# Patient Record
Sex: Male | Born: 1950 | Race: White | Hispanic: No | Marital: Married | Smoking: Never smoker
Health system: Southern US, Community
[De-identification: ages and names within clinical notes are randomized; demographics above are authoritative.]

## PROBLEM LIST (undated history)

## (undated) DIAGNOSIS — C679 Malignant neoplasm of bladder, unspecified: Secondary | ICD-10-CM

## (undated) DIAGNOSIS — I219 Acute myocardial infarction, unspecified: Secondary | ICD-10-CM

## (undated) DIAGNOSIS — N2889 Other specified disorders of kidney and ureter: Secondary | ICD-10-CM

## (undated) DIAGNOSIS — N400 Enlarged prostate without lower urinary tract symptoms: Secondary | ICD-10-CM

## (undated) HISTORY — PX: TONSILLECTOMY: SUR1361

## (undated) HISTORY — PX: VASECTOMY: SHX75

## (undated) HISTORY — PX: TRANSURETHRAL RESECTION OF BLADDER TUMOR: SHX2575

## (undated) HISTORY — PX: CORONARY ANGIOPLASTY: SHX604

---

## 2007-11-29 DIAGNOSIS — I219 Acute myocardial infarction, unspecified: Secondary | ICD-10-CM

## 2007-11-29 HISTORY — DX: Acute myocardial infarction, unspecified: I21.9

## 2011-11-29 HISTORY — PX: HERNIA REPAIR: SHX51

## 2014-09-24 DIAGNOSIS — C641 Malignant neoplasm of right kidney, except renal pelvis: Secondary | ICD-10-CM | POA: Insufficient documentation

## 2014-09-24 DIAGNOSIS — N138 Other obstructive and reflux uropathy: Secondary | ICD-10-CM

## 2014-09-24 DIAGNOSIS — N401 Enlarged prostate with lower urinary tract symptoms: Secondary | ICD-10-CM

## 2014-09-24 HISTORY — DX: Benign prostatic hyperplasia with lower urinary tract symptoms: N40.1

## 2014-09-24 HISTORY — DX: Benign prostatic hyperplasia with lower urinary tract symptoms: N13.8

## 2014-09-24 HISTORY — DX: Malignant neoplasm of right kidney, except renal pelvis: C64.1

## 2014-10-13 DIAGNOSIS — I10 Essential (primary) hypertension: Secondary | ICD-10-CM

## 2014-10-13 DIAGNOSIS — E782 Mixed hyperlipidemia: Secondary | ICD-10-CM

## 2014-10-13 DIAGNOSIS — E785 Hyperlipidemia, unspecified: Secondary | ICD-10-CM

## 2014-10-13 DIAGNOSIS — N451 Epididymitis: Secondary | ICD-10-CM

## 2014-10-13 DIAGNOSIS — N509 Disorder of male genital organs, unspecified: Secondary | ICD-10-CM | POA: Insufficient documentation

## 2014-10-13 DIAGNOSIS — N486 Induration penis plastica: Secondary | ICD-10-CM

## 2014-10-13 DIAGNOSIS — K4090 Unilateral inguinal hernia, without obstruction or gangrene, not specified as recurrent: Secondary | ICD-10-CM

## 2014-10-13 DIAGNOSIS — N529 Male erectile dysfunction, unspecified: Secondary | ICD-10-CM

## 2014-10-13 DIAGNOSIS — R350 Frequency of micturition: Secondary | ICD-10-CM

## 2014-10-13 DIAGNOSIS — I2510 Atherosclerotic heart disease of native coronary artery without angina pectoris: Secondary | ICD-10-CM

## 2014-10-13 DIAGNOSIS — N4340 Spermatocele of epididymis, unspecified: Secondary | ICD-10-CM

## 2014-10-13 HISTORY — DX: Mixed hyperlipidemia: E78.2

## 2014-10-13 HISTORY — DX: Epididymitis: N45.1

## 2014-10-13 HISTORY — DX: Male erectile dysfunction, unspecified: N52.9

## 2014-10-13 HISTORY — DX: Atherosclerotic heart disease of native coronary artery without angina pectoris: I25.10

## 2014-10-13 HISTORY — DX: Frequency of micturition: R35.0

## 2014-10-13 HISTORY — DX: Spermatocele of epididymis, unspecified: N43.40

## 2014-10-13 HISTORY — DX: Hyperlipidemia, unspecified: E78.5

## 2014-10-13 HISTORY — DX: Disorder of male genital organs, unspecified: N50.9

## 2014-10-13 HISTORY — DX: Induration penis plastica: N48.6

## 2014-10-13 HISTORY — DX: Unilateral inguinal hernia, without obstruction or gangrene, not specified as recurrent: K40.90

## 2014-10-13 HISTORY — DX: Essential (primary) hypertension: I10

## 2014-11-28 DIAGNOSIS — C801 Malignant (primary) neoplasm, unspecified: Secondary | ICD-10-CM

## 2014-11-28 HISTORY — DX: Malignant (primary) neoplasm, unspecified: C80.1

## 2014-12-03 DIAGNOSIS — N2889 Other specified disorders of kidney and ureter: Secondary | ICD-10-CM

## 2014-12-03 HISTORY — DX: Other specified disorders of kidney and ureter: N28.89

## 2014-12-04 ENCOUNTER — Other Ambulatory Visit: Payer: Self-pay | Admitting: Urology

## 2014-12-04 DIAGNOSIS — N2889 Other specified disorders of kidney and ureter: Secondary | ICD-10-CM

## 2014-12-09 ENCOUNTER — Ambulatory Visit
Admission: RE | Admit: 2014-12-09 | Discharge: 2014-12-09 | Disposition: A | Discharge status: Home / Self Care | Payer: BLUE CROSS/BLUE SHIELD | Source: Ambulatory Visit | Attending: Urology | Admitting: Urology

## 2014-12-09 ENCOUNTER — Encounter (INDEPENDENT_AMBULATORY_CARE_PROVIDER_SITE_OTHER): Payer: Self-pay

## 2014-12-09 DIAGNOSIS — N2889 Other specified disorders of kidney and ureter: Secondary | ICD-10-CM

## 2014-12-09 HISTORY — DX: Benign prostatic hyperplasia without lower urinary tract symptoms: N40.0

## 2014-12-09 HISTORY — DX: Other specified disorders of kidney and ureter: N28.89

## 2014-12-09 HISTORY — DX: Acute myocardial infarction, unspecified: I21.9

## 2014-12-09 HISTORY — PX: IR GENERIC HISTORICAL: IMG1180011

## 2014-12-09 HISTORY — DX: Malignant neoplasm of bladder, unspecified: C67.9

## 2014-12-09 NOTE — Consult Note (Signed)
Chief Complaint: Chief Complaint  Patient presents with  . Advice Only    Consult for Cryoablation of Right Renal Mass    Referring Physician(s): Singh,Brady C  History of Present Illness: Brady Singh is a 64 y.o. male with a history of hematuria and prior bladder cancer. Surveillance CT imaging demonstrated a solid enhancing right lower pole heterogeneous mass compatible with a incidental renal cell carcinoma. This was confirmed by MRI 11/27/2014. He presents for outpatient evaluation for cryoablation consideration. No current abdominal pain, flank pain, gross hematuria or significant symptom. Patient remains asymptomatic. No current urinary tract issue. No fevers.  Past Medical History  Diagnosis Date  . Bladder cancer   . BPH (benign prostatic hyperplasia)   . Right renal mass   . Myocardial infarction 2009    Past Surgical History  Procedure Laterality Date  . Vasectomy    . Tonsillectomy    . Coronary angioplasty      Coronary angio w/ stent placement x 2   . Transurethral resection of bladder tumor  2007; 2010    . Hernia repair  2013    Left Inguinal Hernia repair    Allergies: Review of patient's allergies indicates no known allergies.  Medications: Prior to Admission medications   Medication Sig Start Date End Date Taking? Authorizing Provider  amLODipine (NORVASC) 10 MG tablet Take 10 mg by mouth daily.   Yes Pamala Hurry, MD  aspirin 325 MG EC tablet Take 81 mg by mouth daily.   Yes Pamala Hurry, MD  atorvastatin (LIPITOR) 40 MG tablet Take 40 mg by mouth daily.   Yes Pamala Hurry, MD  carvedilol (COREG) 12.5 MG tablet Take 12.5 mg by mouth 2 (two) times daily with a meal.   Yes Pamala Hurry, MD  finasteride (PROSCAR) 5 MG tablet Take 5 mg by mouth daily.   Yes Pamala Hurry, MD  HYDROcodone-acetaminophen (NORCO/VICODIN) 5-325 MG per tablet Take 1 tablet by mouth every 6 (six) hours as needed for moderate pain.   Yes Pamala Hurry,  MD  lisinopril (PRINIVIL,ZESTRIL) 40 MG tablet Take 40 mg by mouth daily.   Yes Pamala Hurry, MD  cefdinir (OMNICEF) 300 MG capsule Take 300 mg by mouth 2 (two) times daily. Rx'd on 12/04/2014.  Take 300 mg po bid x 21 days.    Pamala Hurry, MD  ciprofloxacin (CIPRO) 500 MG tablet Take 500 mg by mouth 2 (two) times daily.    Pamala Hurry, MD    No family history on file.  History   Social History  . Marital Status: Married    Spouse Name: N/A    Number of Children: N/A  . Years of Education: N/A   Social History Main Topics  . Smoking status: Never Smoker   . Smokeless tobacco: Never Used  . Alcohol Use: No  . Drug Use: No  . Sexual Activity: None   Other Topics Concern  . None   Social History Narrative  . None    Review of Systems  Constitutional: Negative for diaphoresis, appetite change and fatigue.  Respiratory: Negative.  Negative for cough and chest tightness.   Cardiovascular: Negative.  Negative for chest pain.  Gastrointestinal: Negative.  Negative for abdominal distention.  Genitourinary: Negative for dysuria, urgency, frequency, hematuria, flank pain and difficulty urinating.  Skin: Negative.   Psychiatric/Behavioral: Negative.     Vital Signs: BP 112/59 mmHg  Pulse 55  Temp(Src) 98 F (36.7  C) (Oral)  Resp 14  Ht 5\' 11"  (1.803 m)  Wt 201 lb (91.173 kg)  BMI 28.05 kg/m2  SpO2 57%  Physical Exam  Constitutional: He appears well-developed and well-nourished. No distress.  Cardiovascular: Normal rate, regular rhythm, normal heart sounds and intact distal pulses.  Exam reveals no friction rub.   No murmur heard. Pulmonary/Chest: Effort normal and breath sounds normal. No respiratory distress. He has no wheezes.  Abdominal: Soft. He exhibits no distension.  Skin: Skin is warm and dry. No rash noted. He is not diaphoretic. No erythema.  Psychiatric: He has a normal mood and affect. His behavior is normal. Judgment and thought content normal.     Imaging: CT from 10/29/2014 and MRI from 11/27/2014 were reviewed. Imaging performed at [facility]. This confirms a 1.7 cm heterogeneous enhancing solid right kidney lower pole mass compatible with a renal cell carcinoma. Lesion size and location is amenable to percutaneous CT-guided ablation. Imaging reviewed with the patient and his wife.   Labs: Most recent labs demonstrate a creatinine of 0.9 from 10/21/2014.  Assessment and Plan:  1.7 cm right lower pole solid enhancing renal mass compatible with a renal cell carcinoma. Lesion location and size is amenable to percutaneous CT-guided ablation. This option of treatment was discussed in detail with the patient including the procedure, risks, benefits and alternatives. He would like to proceed with the ablation procedure rather than surveillance. This will be scheduled electively in the next few weeks at [facility].   I spent a total of 30 minutes face to face in clinical consultation, greater than 50% of which was counseling/coordinating care for the patient.  SignedGreggory Singh 12/09/2014, 3:48 PM

## 2014-12-22 ENCOUNTER — Telehealth: Payer: Self-pay | Admitting: Emergency Medicine

## 2014-12-22 NOTE — Telephone Encounter (Signed)
LMOVM LETTING PT KNOW THAT THE INS. HAS DENIED THE ABLATION PROCEDURE.  WE ARE GOING TO DO AN APPEALS PROCESS ON THURS, WHILE DR [facility] IS IN THE OFFICE WITH ME.  WITH KEEP HIM POSTED. CALL IFANY ?'S

## 2015-02-13 ENCOUNTER — Other Ambulatory Visit: Payer: Self-pay | Admitting: Urology

## 2015-02-13 ENCOUNTER — Other Ambulatory Visit: Payer: Self-pay | Admitting: Radiology

## 2015-02-13 ENCOUNTER — Other Ambulatory Visit (HOSPITAL_COMMUNITY): Payer: Self-pay | Admitting: Interventional Radiology

## 2015-02-13 DIAGNOSIS — N2889 Other specified disorders of kidney and ureter: Secondary | ICD-10-CM

## 2015-02-13 HISTORY — DX: Other specified disorders of kidney and ureter: N28.89

## 2015-02-17 ENCOUNTER — Other Ambulatory Visit: Payer: Self-pay | Admitting: Radiology

## 2015-02-17 DIAGNOSIS — N2889 Other specified disorders of kidney and ureter: Secondary | ICD-10-CM

## 2015-02-24 LAB — CREATININE WITH EST GFR
CREATININE: 1.3 mg/dL (ref 0.50–1.35)
GFR, EST AFRICAN AMERICAN: 67 mL/min
GFR, Est Non African American: 58 mL/min — ABNORMAL LOW

## 2015-02-24 LAB — BUN: BUN: 15 mg/dL (ref 6–23)

## 2015-03-04 ENCOUNTER — Ambulatory Visit
Admission: RE | Admit: 2015-03-04 | Discharge: 2015-03-04 | Disposition: A | Discharge status: Home / Self Care | Payer: BLUE CROSS/BLUE SHIELD | Source: Ambulatory Visit | Attending: Urology | Admitting: Urology

## 2015-03-04 DIAGNOSIS — N2889 Other specified disorders of kidney and ureter: Secondary | ICD-10-CM | POA: Insufficient documentation

## 2015-03-04 NOTE — Progress Notes (Signed)
Patient ID: Brady Singh, male   DOB: 04/07/1951, 64 y.o.   MRN: [id number]    Chief Complaint: 1 month status post right renal cell carcinoma cryoablation  Referring Physician(s): Hall,Marshall C  History of Present Illness: Brady Singh is a 64 y.o. male with a history of hematuria and prior bladder cancer. Surveillance CT imaging identified a solid enhancing right inferior pole mass compatible with a renal cell carcinoma. This was confirmed by MRI. Right lower pole mass measured 1.7 cm. He underwent successful CT-guided cryoablation 02/12/2015 at [facility]. He was discharged the following day after overnight recovery. He has recovered at home very well. No current physical limitations. No current abdominal pain, flank pain, dysuria or hematuria. He continues to be very active.  Past Medical History  Diagnosis Date  . Bladder cancer   . BPH (benign prostatic hyperplasia)   . Right renal mass   . Myocardial infarction 2009    Past Surgical History  Procedure Laterality Date  . Vasectomy    . Tonsillectomy    . Coronary angioplasty      Coronary angio w/ stent placement x 2   . Transurethral resection of bladder tumor  2007; 2010    . Hernia repair  2013    Left Inguinal Hernia repair    Allergies: Review of patient's allergies indicates no known allergies.  Medications: Prior to Admission medications   Medication Sig Start Date End Date Taking? Authorizing Provider  amLODipine (NORVASC) 10 MG tablet Take 10 mg by mouth daily.    Pamala Hurry, MD  aspirin 325 MG EC tablet Take 81 mg by mouth daily.    Pamala Hurry, MD  atorvastatin (LIPITOR) 40 MG tablet Take 40 mg by mouth daily.    Pamala Hurry, MD  carvedilol (COREG) 12.5 MG tablet Take 12.5 mg by mouth 2 (two) times daily with a meal.    Pamala Hurry, MD  cefdinir (OMNICEF) 300 MG capsule Take 300 mg by mouth 2 (two) times daily. Rx'd on 12/04/2014.  Take 300 mg po bid x 21 days.    Pamala Hurry, MD  ciprofloxacin (CIPRO) 500 MG tablet Take 500 mg by mouth 2 (two) times daily.    Pamala Hurry, MD  finasteride (PROSCAR) 5 MG tablet Take 5 mg by mouth daily.    Pamala Hurry, MD  HYDROcodone-acetaminophen (NORCO/VICODIN) 5-325 MG per tablet Take 1 tablet by mouth every 6 (six) hours as needed for moderate pain.    Pamala Hurry, MD  lisinopril (PRINIVIL,ZESTRIL) 40 MG tablet Take 40 mg by mouth daily.    Pamala Hurry, MD     No family history on file.  History   Social History  . Marital Status: Married    Spouse Name: N/A  . Number of Children: N/A  . Years of Education: N/A   Social History Main Topics  . Smoking status: Never Smoker   . Smokeless tobacco: Never Used  . Alcohol Use: No  . Drug Use: No  . Sexual Activity: Not on file   Other Topics Concern  . Not on file   Social History Narrative  . No narrative on file     Review of Systems: A 12 point ROS discussed and pertinent positives are indicated in the HPI above.  All other systems are negative.  Review of Systems  Constitutional: Negative for fever, diaphoresis, activity change, appetite change and fatigue.  Respiratory: Negative for cough,  chest tightness and shortness of breath.   Cardiovascular: Negative for chest pain and palpitations.  Gastrointestinal: Negative for abdominal distention.    Vital Signs: There were no vitals taken for this visit.  Physical Exam  Constitutional: He appears well-developed and well-nourished. No distress.  Cardiovascular: Normal rate, regular rhythm and intact distal pulses.   Murmur heard. Pulmonary/Chest: Effort normal and breath sounds normal. No respiratory distress. He has no wheezes.  Abdominal: Soft. Bowel sounds are normal. He exhibits no distension.  No CVA tenderness.  Skin: Skin is warm and dry. No rash noted. He is not diaphoretic. No erythema.  Psychiatric: He has a normal mood and affect. His behavior is normal. Judgment normal.     Imaging: No imaging performed today. Imaging will be repeated 3 months posttreatment in late June or July 2016.  Labs:   BMP:  Recent Labs  02/24/15 1231  BUN 15  CREATININE 1.30  GFRNONAA 58*  GFRAA 67     Assessment and Plan:  1 month status post 1.7 cm right renal cell carcinoma successful CT-guided cryoablation. He has recovered very well as an outpatient. No current symptoms including abdominal pain, flank pain, dysuria or hematuria. No fevers. Stable creatinine.  Plan for an initial post ablation CT in late June or July 2016 which will be 3 months posttreatment. I plan to see him back after the CT scan. He is also closely followed by Dr. [place] Crane.  SignedGreggory Keen 03/04/2015, 1:19 PM   I spent a total of   25 Minutes in face to face in clinical consultation, greater than 50% of which was counseling/coordinating care for the patient.

## 2015-05-07 ENCOUNTER — Other Ambulatory Visit: Payer: Self-pay | Admitting: *Deleted

## 2015-05-07 DIAGNOSIS — C641 Malignant neoplasm of right kidney, except renal pelvis: Secondary | ICD-10-CM

## 2015-05-13 ENCOUNTER — Other Ambulatory Visit: Payer: Self-pay | Admitting: Urology

## 2015-05-13 DIAGNOSIS — N2889 Other specified disorders of kidney and ureter: Secondary | ICD-10-CM

## 2015-05-15 ENCOUNTER — Other Ambulatory Visit (HOSPITAL_COMMUNITY): Payer: Self-pay | Admitting: Interventional Radiology

## 2015-05-15 DIAGNOSIS — N2889 Other specified disorders of kidney and ureter: Secondary | ICD-10-CM

## 2015-06-02 ENCOUNTER — Ambulatory Visit
Admission: RE | Admit: 2015-06-02 | Discharge: 2015-06-02 | Disposition: A | Discharge status: Home / Self Care | Payer: Medicare Other | Source: Ambulatory Visit | Attending: Interventional Radiology | Admitting: Interventional Radiology

## 2015-06-02 ENCOUNTER — Other Ambulatory Visit: Payer: BLUE CROSS/BLUE SHIELD

## 2015-06-02 DIAGNOSIS — N2889 Other specified disorders of kidney and ureter: Secondary | ICD-10-CM

## 2015-06-02 NOTE — Progress Notes (Signed)
Patient ID: Brady Singh, male   DOB: Apr 07, 1951, 64 y.o.   MRN: [id number]    Chief Complaint: Chief Complaint  Patient presents with  . Follow-up    4 mo follow up Cryoablation of Right Renal Mass      Referring Physician(s): Heather Roberts  History of Present Illness: Brady Singh is a 64 y.o. male with a history of prior bladder cancer, status post cystoscopic resection now undergoing surveillance. Surveillance CT imaging demonstrated a new solid enhancing right renal mass compatible with a renal cell carcinoma. This was confirmed by MRI. Lesion size 1.7 cm. He underwent successful cryoablation 02/12/2015 at [facility]. He continues to do very well. No current physical limitations. He travels extensively during his retirement. No current abdominal pain, flank pain, dysuria or hematuria. He remains very active. He returns for three-month outpatient follow-up.  Past Medical History  Diagnosis Date  . Bladder cancer   . BPH (benign prostatic hyperplasia)   . Right renal mass   . Myocardial infarction 2009    Past Surgical History  Procedure Laterality Date  . Vasectomy    . Tonsillectomy    . Coronary angioplasty      Coronary angio w/ stent placement x 2   . Transurethral resection of bladder tumor  2007; 2010    . Hernia repair  2013    Left Inguinal Hernia repair    Allergies: Review of patient's allergies indicates no known allergies.  Medications: Prior to Admission medications   Medication Sig Start Date End Date Taking? Authorizing Provider  amLODipine (NORVASC) 10 MG tablet Take 10 mg by mouth daily.    Pamala Hurry, MD  aspirin 325 MG EC tablet Take 81 mg by mouth daily.    Pamala Hurry, MD  atorvastatin (LIPITOR) 40 MG tablet Take 40 mg by mouth daily.    Pamala Hurry, MD  carvedilol (COREG) 12.5 MG tablet Take 12.5 mg by mouth 2 (two) times daily with a meal.    Pamala Hurry, MD  cefdinir (OMNICEF) 300 MG capsule Take 300  mg by mouth 2 (two) times daily. Rx'd on 12/04/2014.  Take 300 mg po bid x 21 days.    Pamala Hurry, MD  ciprofloxacin (CIPRO) 500 MG tablet Take 500 mg by mouth 2 (two) times daily.    Pamala Hurry, MD  finasteride (PROSCAR) 5 MG tablet Take 5 mg by mouth daily.    Pamala Hurry, MD  HYDROcodone-acetaminophen (NORCO/VICODIN) 5-325 MG per tablet Take 1 tablet by mouth every 6 (six) hours as needed for moderate pain.    Pamala Hurry, MD  lisinopril (PRINIVIL,ZESTRIL) 40 MG tablet Take 40 mg by mouth daily.    Pamala Hurry, MD     No family history on file.  History   Social History  . Marital Status: Married    Spouse Name: N/A  . Number of Children: N/A  . Years of Education: N/A   Social History Main Topics  . Smoking status: Never Smoker   . Smokeless tobacco: Never Used  . Alcohol Use: No  . Drug Use: No  . Sexual Activity: Not on file   Other Topics Concern  . Not on file   Social History Narrative  . No narrative on file    Review of Systems: A 12 point ROS discussed and pertinent positives are indicated in the HPI above.  All other systems are negative.  Review of Systems  Vital Signs: BP 157/75 mmHg  Pulse 59  Temp(Src) 98.2 F (36.8 C) (Oral)  Resp 13  SpO2 100%  Physical Exam  Constitutional: He appears well-developed and well-nourished. No distress.  Cardiovascular: Normal rate, regular rhythm and normal heart sounds.   No murmur heard. Pulmonary/Chest: Effort normal and breath sounds normal. No respiratory distress. He has no wheezes.  Abdominal: Soft. Bowel sounds are normal. He exhibits no distension.  Skin: He is not diaphoretic.     Imaging: [place] CT performed 05/25/2015 demonstrates an ablation defect encompassing the right renal lower pole lesion. Nonspecific low level enhancement within the cryoablation bed. No evidence of recurrence or metastatic disease. This will serve as a new baseline for continued  follow-up.  Labs:   BMP:  Recent Labs  02/24/15 1231  BUN 15  CREATININE 1.30  GFRNONAA 58*  GFRAA 67     Assessment and Plan:  3 months status post 1.7 cm right renal cell carcinoma CT-guided cryoablation. He remains asymptomatic. Post ablation CT at 3 months demonstrates no significant finding. No evidence of recurrence or metastatic disease.  Repeat CT abdomen and pelvis (renal mass protocol) in 6 months with an outpatient follow-up visit (January 2017)  Thank you for this interesting consult.  I greatly enjoyed meeting Brady Singh and look forward to participating in their care.  SignedGreggory Keen 06/02/2015, 10:30 AM   I spent a total of    25 Minutes in face to face in clinical consultation, greater than 50% of which was counseling/coordinating care for this patient with right renal cell carcinoma, status post ablation.

## 2015-06-18 ENCOUNTER — Other Ambulatory Visit: Payer: BLUE CROSS/BLUE SHIELD

## 2015-08-31 DIAGNOSIS — I2510 Atherosclerotic heart disease of native coronary artery without angina pectoris: Secondary | ICD-10-CM

## 2015-08-31 DIAGNOSIS — E7800 Pure hypercholesterolemia, unspecified: Secondary | ICD-10-CM

## 2015-08-31 DIAGNOSIS — I70229 Atherosclerosis of native arteries of extremities with rest pain, unspecified extremity: Secondary | ICD-10-CM

## 2015-08-31 HISTORY — DX: Pure hypercholesterolemia, unspecified: E78.00

## 2015-08-31 HISTORY — DX: Atherosclerotic heart disease of native coronary artery without angina pectoris: I25.10

## 2015-08-31 HISTORY — DX: Atherosclerosis of native arteries of extremities with rest pain, unspecified extremity: I70.229

## 2015-12-09 ENCOUNTER — Other Ambulatory Visit (HOSPITAL_COMMUNITY): Payer: Self-pay | Admitting: Interventional Radiology

## 2015-12-09 ENCOUNTER — Other Ambulatory Visit: Payer: Self-pay | Admitting: *Deleted

## 2015-12-09 DIAGNOSIS — N2889 Other specified disorders of kidney and ureter: Secondary | ICD-10-CM

## 2016-01-12 ENCOUNTER — Ambulatory Visit
Admission: RE | Admit: 2016-01-12 | Discharge: 2016-01-12 | Disposition: A | Discharge status: Home / Self Care | Payer: Medicare Other | Source: Ambulatory Visit | Attending: Interventional Radiology | Admitting: Interventional Radiology

## 2016-01-12 ENCOUNTER — Ambulatory Visit (HOSPITAL_COMMUNITY)
Admission: RE | Admit: 2016-01-12 | Discharge: 2016-01-12 | Disposition: A | Discharge status: Home / Self Care | Payer: Medicare Other | Source: Ambulatory Visit | Attending: Interventional Radiology | Admitting: Interventional Radiology

## 2016-01-12 DIAGNOSIS — N2889 Other specified disorders of kidney and ureter: Secondary | ICD-10-CM | POA: Diagnosis not present

## 2016-01-12 DIAGNOSIS — I2510 Atherosclerotic heart disease of native coronary artery without angina pectoris: Secondary | ICD-10-CM | POA: Diagnosis not present

## 2016-01-12 DIAGNOSIS — K8020 Calculus of gallbladder without cholecystitis without obstruction: Secondary | ICD-10-CM | POA: Insufficient documentation

## 2016-01-12 DIAGNOSIS — N400 Enlarged prostate without lower urinary tract symptoms: Secondary | ICD-10-CM | POA: Diagnosis not present

## 2016-01-12 MED ORDER — IOHEXOL 300 MG/ML  SOLN
100.0000 mL | Freq: Once | INTRAMUSCULAR | Status: AC | PRN
  Administered 2016-01-12: 100 mL via INTRAVENOUS

## 2016-01-12 NOTE — Progress Notes (Signed)
Patient ID: Brady Singh, male   DOB: 1951/02/16, 65 y.o.   MRN: [id number]       Chief Complaint: 11 months status post right renal cell carcinoma cryoablation.  Referring Physician(s): Nevada Crane  History of Present Illness: Brady Singh is a 65 y.o. male with a history of prior bladder cancer, status post cystoscopic resection and undergoing continue surveillance. He underwent successful right renal cell carcinoma cryoablation approximately 11 months ago. Surveillance imaging today confirms regression of the ablation defect. No residual or recurrent tumor. No evidence of metastatic disease. No delayed complication or acute process. Overall he continues to do very well. No current physical limitations. He continues to travel extensively during his retirement. No current abdominal flank pain. No dysuria or hematuria. He returns for outpatient follow-up to review his imaging.  Past Medical History  Diagnosis Date  . Bladder cancer   . BPH (benign prostatic hyperplasia)   . Right renal mass   . Myocardial infarction [facility]) 2009    Past Surgical History  Procedure Laterality Date  . Vasectomy    . Tonsillectomy    . Coronary angioplasty      Coronary angio w/ stent placement x 2   . Transurethral resection of bladder tumor  2007; 2010    . Hernia repair  2013    Left Inguinal Hernia repair    Allergies: Review of patient's allergies indicates no known allergies.  Medications: Prior to Admission medications   Medication Sig Start Date End Date Taking? Authorizing Provider  amLODipine (NORVASC) 10 MG tablet Take 10 mg by mouth daily.   Yes Pamala Hurry, MD  aspirin 81 MG tablet Take 81 mg by mouth daily.   Yes Historical Provider, MD  atorvastatin (LIPITOR) 40 MG tablet Take 40 mg by mouth daily.   Yes Pamala Hurry, MD  carvedilol (COREG) 12.5 MG tablet Take 12.5 mg by mouth 2 (two) times daily with a meal.   Yes Pamala Hurry, MD  finasteride (PROSCAR) 5 MG tablet Take  5 mg by mouth daily.   Yes Pamala Hurry, MD  lisinopril (PRINIVIL,ZESTRIL) 40 MG tablet Take 40 mg by mouth daily.   Yes Pamala Hurry, MD  aspirin 325 MG EC tablet Take 81 mg by mouth daily. Reported on 01/12/2016    Pamala Hurry, MD  cefdinir (OMNICEF) 300 MG capsule Take 300 mg by mouth 2 (two) times daily. Reported on 01/12/2016    Pamala Hurry, MD  ciprofloxacin (CIPRO) 500 MG tablet Take 500 mg by mouth 2 (two) times daily. Reported on 01/12/2016    Pamala Hurry, MD  HYDROcodone-acetaminophen (NORCO/VICODIN) 5-325 MG per tablet Take 1 tablet by mouth every 6 (six) hours as needed for moderate pain. Reported on 01/12/2016    Pamala Hurry, MD     No family history on file.  Social History   Social History  . Marital Status: Married    Spouse Name: N/A  . Number of Children: N/A  . Years of Education: N/A   Social History Main Topics  . Smoking status: Never Smoker   . Smokeless tobacco: Never Used  . Alcohol Use: No  . Drug Use: No  . Sexual Activity: Not on file   Other Topics Concern  . Not on file   Social History Narrative  . No narrative on file    ECOG Status: 0 - Asymptomatic  Review of Systems: A 12 point ROS discussed and pertinent positives are  indicated in the HPI above.  All other systems are negative.  Review of Systems  Constitutional: Negative for fever, activity change, fatigue and unexpected weight change.  Respiratory: Negative for shortness of breath.   Cardiovascular: Negative for chest pain.    Vital Signs: BP 138/73 mmHg  Pulse 61  Temp(Src) 98 F (36.7 C) (Oral)  Resp 15  SpO2 97%  Physical Exam  Constitutional: He appears well-developed and well-nourished. No distress.  Cardiovascular: Normal rate and regular rhythm.   No murmur heard. Pulmonary/Chest: Effort normal and breath sounds normal. He has no wheezes.  Abdominal: Soft. Bowel sounds are normal. He exhibits no distension and no mass. There is no tenderness.    Skin: Skin is warm and dry. No rash noted. He is not diaphoretic. No erythema.  Psychiatric: He has a normal mood and affect. His behavior is normal.     Imaging: Ct Abdomen Pelvis W Wo Contrast  01/12/2016  CLINICAL DATA:  Follow-up cryoablation of right lower renal mass performed on 02/12/2015. History of two resected bladder neoplasms. EXAM: CT ABDOMEN AND PELVIS WITHOUT AND WITH CONTRAST TECHNIQUE: Multidetector CT imaging of the abdomen and pelvis was performed following the standard protocol before and following the bolus administration of intravenous contrast. CONTRAST:  161mL OMNIPAQUE IOHEXOL 300 MG/ML  SOLN COMPARISON:  05/25/2015 CT abdomen. FINDINGS: Lower chest: No significant pulmonary nodules or acute consolidative airspace disease. Coronary atherosclerosis. Hepatobiliary: Normal liver with no liver mass. Tiny layering calcified gallstones measuring up to 5 mm in size in the nondistended gallbladder, with no gallbladder wall thickening or pericholecystic fluid. No biliary ductal dilatation. Pancreas: Normal, with no mass or duct dilation. Spleen: Normal size. No mass. Adrenals/Urinary Tract: Normal adrenals. Stable nonobstructing 3 mm stone in the upper right kidney. No additional renal stones. No hydronephrosis. Normal caliber ureters. There is a 1.6 x 1.3 cm focus in the posterior lower right kidney at the ablation site with faint precontrast hyperdensity and no evidence of enhancement, which is decreased from 2.0 x 1.5 cm on 05/25/2015, in keeping with evolving post ablation scar. Stable small parapelvic renal cysts in both renal sinuses. There are 2 subcentimeter hypodense renal cortical lesions in the posterior upper right kidney, too small to characterize, unchanged, characterized as benign renal cysts on 11/27/2014 MRI. Grossly normal bladder. Stomach/Bowel: Grossly normal stomach. Normal caliber small bowel with no small bowel wall thickening. Normal appendix. Normal large bowel with  no diverticulosis, large bowel wall thickening or pericolonic fat stranding. Vascular/Lymphatic: Atherosclerotic nonaneurysmal abdominal aorta. Patent portal, splenic, hepatic and renal veins. No pathologically enlarged lymph nodes in the abdomen or pelvis. Reproductive: Stable mild prostatomegaly. Other: No pneumoperitoneum, ascites or focal fluid collection. Musculoskeletal: No aggressive appearing focal osseous lesions. Mild-to-moderate degenerative changes in the visualized thoracolumbar spine. IMPRESSION: 1. Evolving post ablation scar in the posterior lower right kidney, with no evidence of residual or recurrent tumor. 2. No evidence of metastatic disease in the abdomen or pelvis. 3. Additional findings include coronary atherosclerosis, cholelithiasis and mild prostatomegaly. Electronically Signed   By: Ilona Sorrel M.D.   On: 01/12/2016 10:58    Labs:  CBC: No results for input(s): WBC, HGB, HCT, PLT in the last 8760 hours.  COAGS: No results for input(s): INR, APTT in the last 8760 hours.  BMP:  Recent Labs  02/24/15 1231  BUN 15  CREATININE 1.30  GFRNONAA 58*  GFRAA 67    LIVER FUNCTION TESTS: No results for input(s): BILITOT, AST, ALT, ALKPHOS, PROT, ALBUMIN in  the last 8760 hours.  TUMOR MARKERS: No results for input(s): AFPTM, CEA, CA199, CHROMGRNA in the last 8760 hours.  Assessment and Plan:  1 year status post right renal cell carcinoma cryoablation. Surveillance imaging demonstrates regression of the ablation defect. Negative for recurrence or residual tumor. Overall he is clinically doing very well and remains asymptomatic.  Plan: Continue annual surveillance with CT and outpatient follow-up at that time (February 2018).  Thank you for this interesting consult.  I greatly enjoyed meeting Brady Singh and look forward to participating in their care.  A copy of this report was sent to the requesting provider on this date.  Electronically Signed: Greggory Keen 01/12/2016, 1:07 PM   I spent a total of    25 Minutes in face to face in clinical consultation, greater than 50% of which was counseling/coordinating care for this patient with right renal cell carcinoma, status post ablation.

## 2016-11-30 ENCOUNTER — Encounter: Payer: Self-pay | Admitting: Interventional Radiology

## 2016-12-29 DIAGNOSIS — C641 Malignant neoplasm of right kidney, except renal pelvis: Secondary | ICD-10-CM

## 2016-12-29 HISTORY — DX: Malignant neoplasm of right kidney, except renal pelvis: C64.1

## 2017-01-04 ENCOUNTER — Other Ambulatory Visit (HOSPITAL_COMMUNITY): Payer: Self-pay | Admitting: Interventional Radiology

## 2017-01-04 DIAGNOSIS — C641 Malignant neoplasm of right kidney, except renal pelvis: Secondary | ICD-10-CM

## 2017-03-28 DIAGNOSIS — I6523 Occlusion and stenosis of bilateral carotid arteries: Secondary | ICD-10-CM

## 2017-03-28 DIAGNOSIS — K429 Umbilical hernia without obstruction or gangrene: Secondary | ICD-10-CM | POA: Insufficient documentation

## 2017-03-28 HISTORY — DX: Occlusion and stenosis of bilateral carotid arteries: I65.23

## 2017-03-28 HISTORY — DX: Umbilical hernia without obstruction or gangrene: K42.9

## 2017-04-10 IMAGING — CT CT ABD-PEL WO/W CM
2 of 11 series · 12 of 46 positions shown, 18 images · IV contrast (OMNIPAQUE)
Comparison: 05/25/2015 CT abdomen.

CLINICAL DATA: Follow-up cryoablation of right lower renal mass
performed on 02/12/2015. History of two resected bladder neoplasms.

EXAM:
CT ABDOMEN AND PELVIS WITHOUT AND WITH CONTRAST
TECHNIQUE: Multidetector CT imaging of the abdomen and pelvis was performed
following the standard protocol before and following the bolus
administration of intravenous contrast.
CONTRAST:  100mL OMNIPAQUE IOHEXOL 300 MG/ML  SOLN

[Series 6: venous · axial · portal-venous · 0.77mm/px · z∈[-519,-168]mm · 10 of 141 slices shown, 15 images]
[im 12/141  soft-tissue]
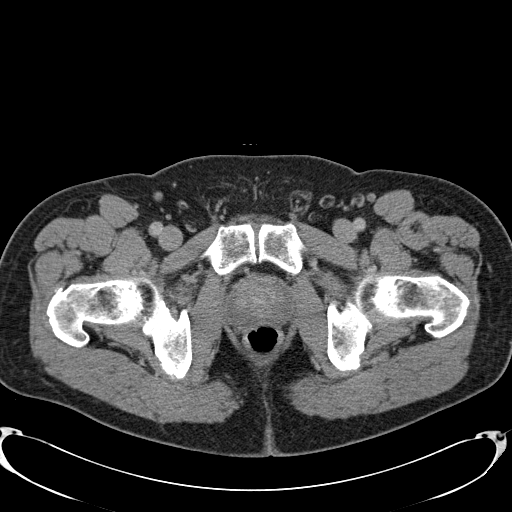
[im 12/141  bone]
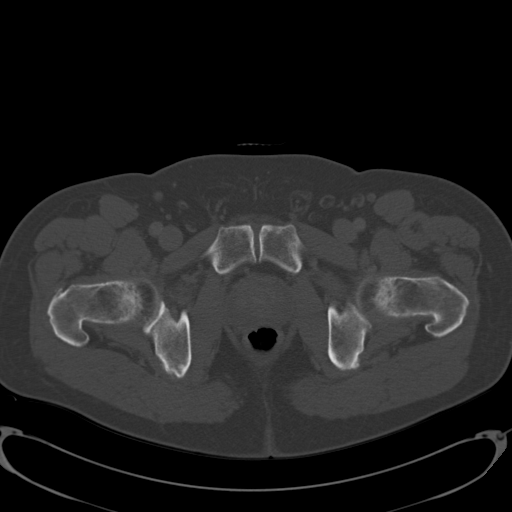
[im 24/141  soft-tissue]
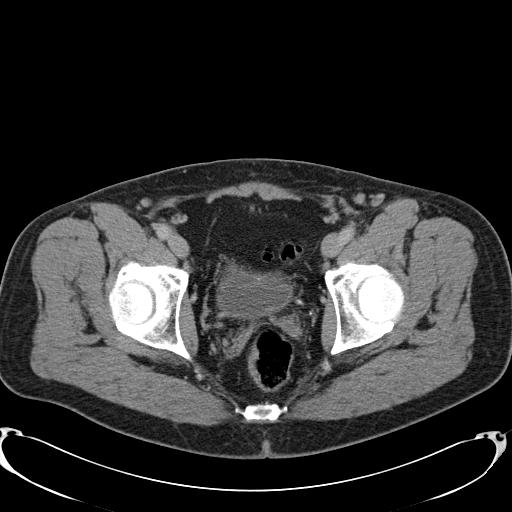
[im 47/141  soft-tissue]
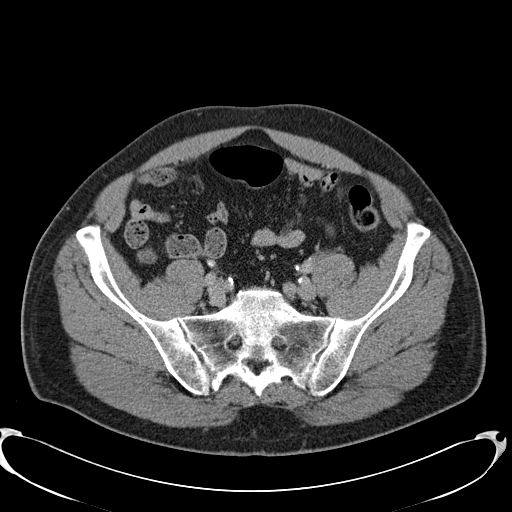
[im 59/141  soft-tissue]
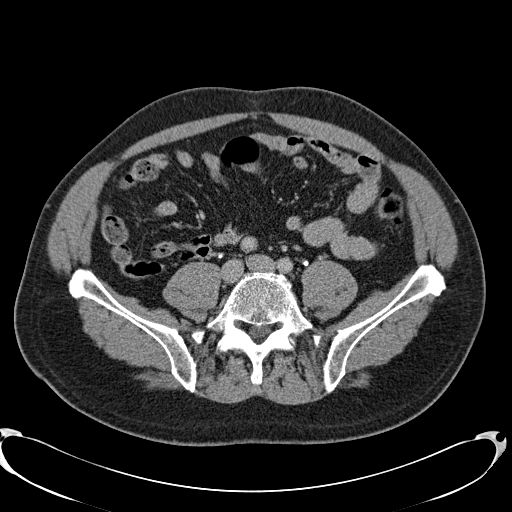
[im 71/141  soft-tissue]
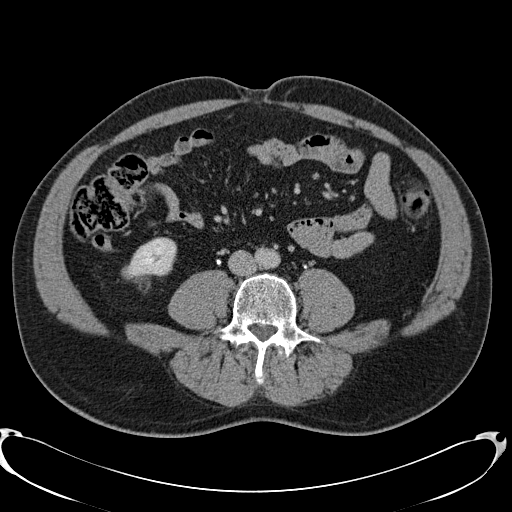
[im 82/141  soft-tissue]
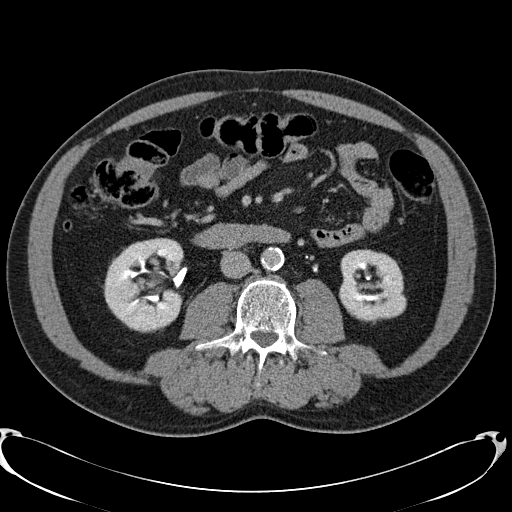
[im 94/141  soft-tissue]
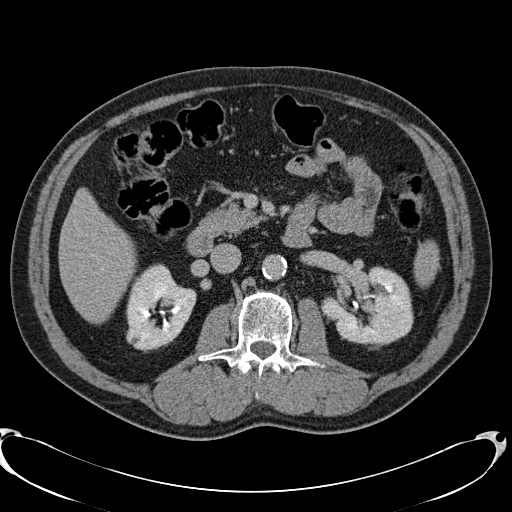
[im 94/141  lung]
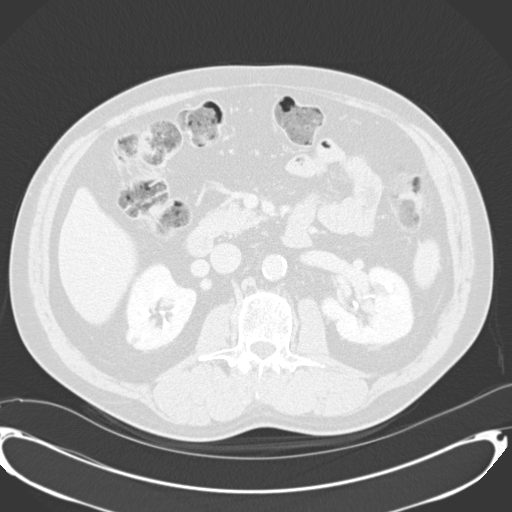
[im 106/141  lung]
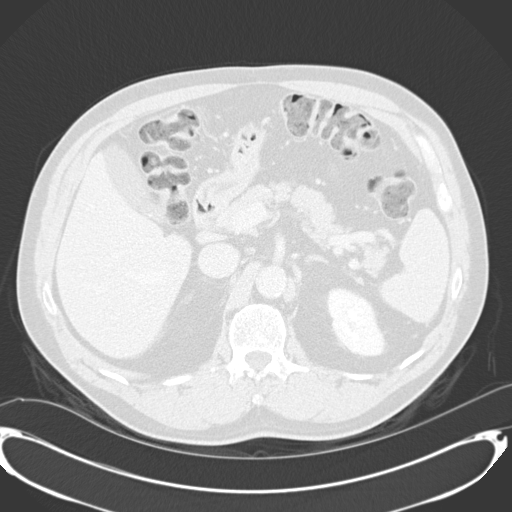
[im 117/141  soft-tissue]
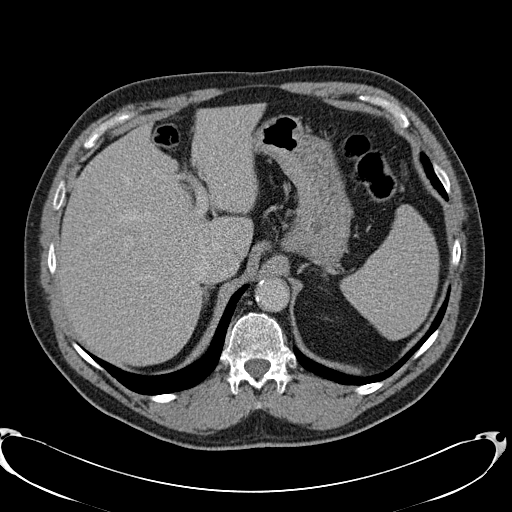
[im 117/141  lung]
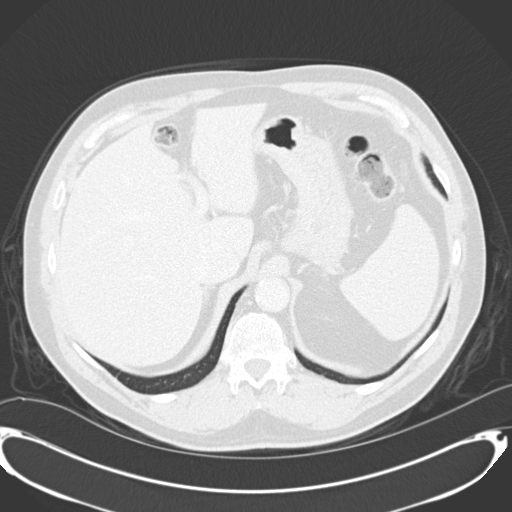
[im 129/141  soft-tissue]
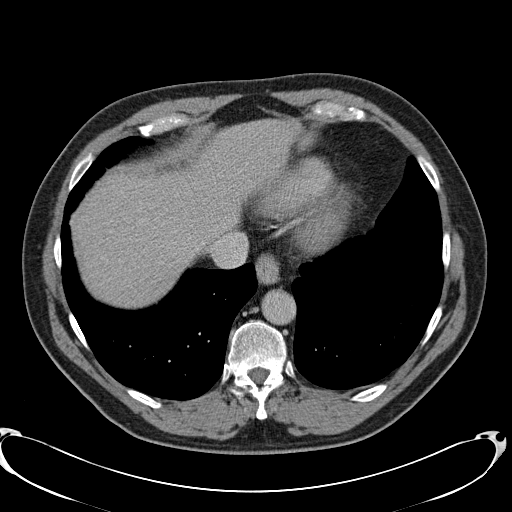
[im 129/141  lung]
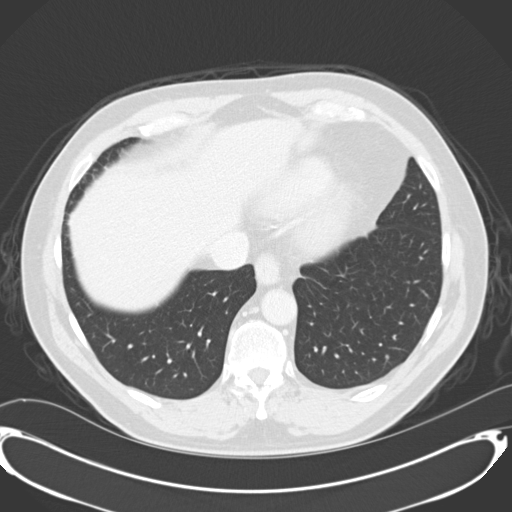
[im 129/141  bone]
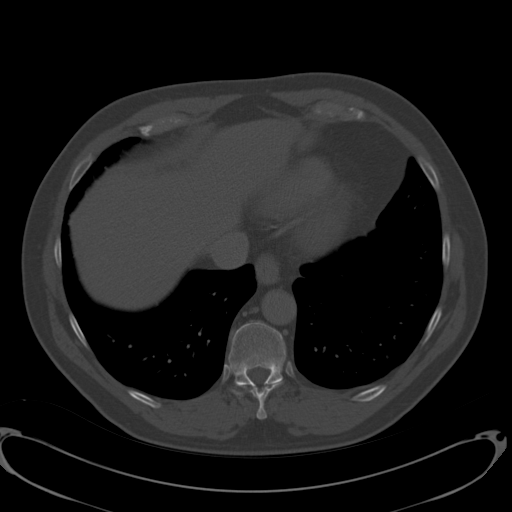

[Series 602: <mpr thick range> · coronal · 0.77mm/px · 2 of 96 slices shown, 3 images]
[im 32/96  soft-tissue]
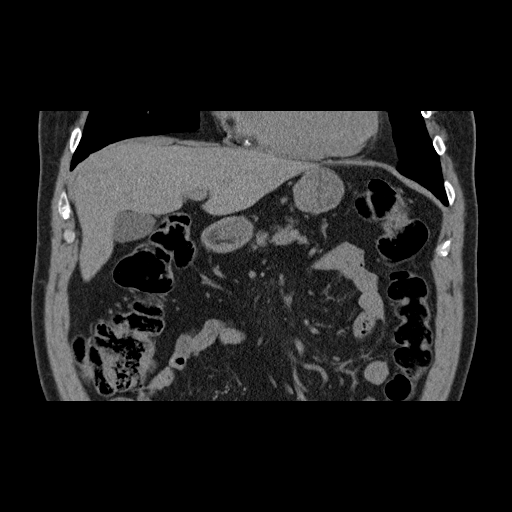
[im 32/96  bone]
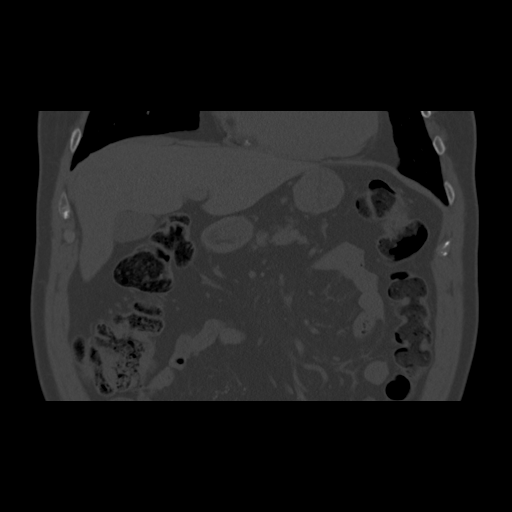
[im 64/96  soft-tissue]
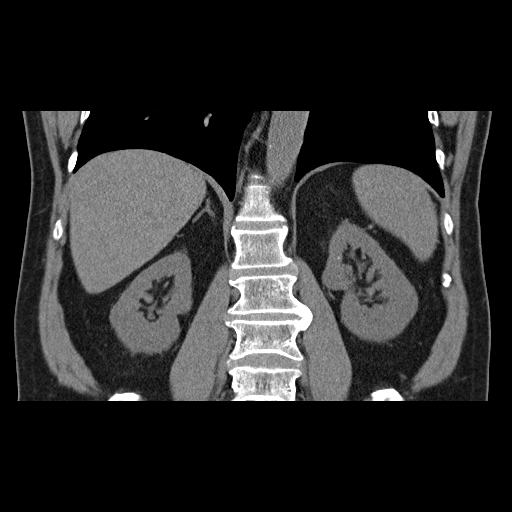

[12 of 46 positions shown; findings below may reference images not displayed]

FINDINGS: Lower chest: No significant pulmonary nodules or acute consolidative
airspace disease. Coronary atherosclerosis.

Hepatobiliary: Normal liver with no liver mass. Tiny layering
calcified gallstones measuring up to 5 mm in size in the
nondistended gallbladder, with no gallbladder wall thickening or
pericholecystic fluid. No biliary ductal dilatation.

Pancreas: Normal, with no mass or duct dilation.

Spleen: Normal size. No mass.

Adrenals/Urinary Tract: Normal adrenals. Stable nonobstructing 3 mm
stone in the upper right kidney. No additional renal stones. No
hydronephrosis. Normal caliber ureters. There is a 1.6 x 1.3 cm
focus in the posterior lower right kidney at the ablation site with
faint precontrast hyperdensity and no evidence of enhancement, which
is decreased from 2.0 x 1.5 cm on 05/25/2015, in keeping with
evolving post ablation scar. Stable small parapelvic renal cysts in
both renal sinuses. There are 2 subcentimeter hypodense renal
cortical lesions in the posterior upper right kidney, too small to
characterize, unchanged, characterized as benign renal cysts on
11/27/2014 MRI. Grossly normal bladder.

Stomach/Bowel: Grossly normal stomach. Normal caliber small bowel
with no small bowel wall thickening. Normal appendix. Normal large
bowel with no diverticulosis, large bowel wall thickening or
pericolonic fat stranding.

Vascular/Lymphatic: Atherosclerotic nonaneurysmal abdominal aorta.
Patent portal, splenic, hepatic and renal veins. No pathologically
enlarged lymph nodes in the abdomen or pelvis.

Reproductive: Stable mild prostatomegaly.

Other: No pneumoperitoneum, ascites or focal fluid collection.

Musculoskeletal: No aggressive appearing focal osseous lesions.
Mild-to-moderate degenerative changes in the visualized
thoracolumbar spine.
IMPRESSION: 1. Evolving post ablation scar in the posterior lower right kidney,
with no evidence of residual or recurrent tumor.
2. No evidence of metastatic disease in the abdomen or pelvis.
3. Additional findings include coronary atherosclerosis,
cholelithiasis and mild prostatomegaly.

## 2017-04-12 DIAGNOSIS — Z01818 Encounter for other preprocedural examination: Secondary | ICD-10-CM

## 2017-04-12 HISTORY — DX: Encounter for other preprocedural examination: Z01.818

## 2017-05-29 DIAGNOSIS — I200 Unstable angina: Secondary | ICD-10-CM | POA: Insufficient documentation

## 2017-05-29 HISTORY — DX: Unstable angina: I20.0

## 2017-06-02 DIAGNOSIS — Z951 Presence of aortocoronary bypass graft: Secondary | ICD-10-CM

## 2017-06-02 DIAGNOSIS — D62 Acute posthemorrhagic anemia: Secondary | ICD-10-CM

## 2017-06-02 HISTORY — DX: Acute posthemorrhagic anemia: D62

## 2017-06-02 HISTORY — DX: Presence of aortocoronary bypass graft: Z95.1

## 2017-06-07 DIAGNOSIS — I4891 Unspecified atrial fibrillation: Secondary | ICD-10-CM | POA: Insufficient documentation

## 2017-06-07 DIAGNOSIS — I9789 Other postprocedural complications and disorders of the circulatory system, not elsewhere classified: Secondary | ICD-10-CM | POA: Insufficient documentation

## 2017-06-07 HISTORY — DX: Unspecified atrial fibrillation: I48.91

## 2017-06-07 HISTORY — DX: Unspecified atrial fibrillation: I97.89

## 2017-11-23 DIAGNOSIS — Z9889 Other specified postprocedural states: Secondary | ICD-10-CM

## 2017-11-23 DIAGNOSIS — Z8774 Personal history of (corrected) congenital malformations of heart and circulatory system: Secondary | ICD-10-CM

## 2017-11-23 DIAGNOSIS — Z7982 Long term (current) use of aspirin: Secondary | ICD-10-CM | POA: Insufficient documentation

## 2017-11-23 HISTORY — DX: Personal history of (corrected) congenital malformations of heart and circulatory system: Z87.74

## 2017-11-23 HISTORY — DX: Long term (current) use of aspirin: Z79.82

## 2017-11-23 HISTORY — DX: Other specified postprocedural states: Z98.890

## 2017-12-26 ENCOUNTER — Other Ambulatory Visit (HOSPITAL_COMMUNITY): Payer: Self-pay | Admitting: Interventional Radiology

## 2017-12-26 DIAGNOSIS — N2889 Other specified disorders of kidney and ureter: Secondary | ICD-10-CM

## 2017-12-27 ENCOUNTER — Other Ambulatory Visit: Payer: Self-pay | Admitting: Radiology

## 2017-12-27 DIAGNOSIS — N2889 Other specified disorders of kidney and ureter: Secondary | ICD-10-CM

## 2018-01-25 ENCOUNTER — Ambulatory Visit
Admission: RE | Admit: 2018-01-25 | Discharge: 2018-01-25 | Disposition: A | Discharge status: Home / Self Care | Payer: Medicare Other | Source: Ambulatory Visit | Attending: Interventional Radiology | Admitting: Interventional Radiology

## 2018-01-25 DIAGNOSIS — N2889 Other specified disorders of kidney and ureter: Secondary | ICD-10-CM

## 2018-01-25 HISTORY — PX: IR RADIOLOGIST EVAL & MGMT: IMG5224

## 2018-01-25 NOTE — Progress Notes (Signed)
Patient ID: Brady Singh, male   DOB: [birth year], 67 y.o.   MRN: [id number]       Chief Complaint:  3 year status post right renal cell carcinoma cryoablation   Referring Physician(s): [place] Crane  History of Present Illness: Brady Singh is a 67 y.o. male with a remote history of bladder cancer, status post cystoscopic resection with continued surveillance. He is now 3 years status post right renal cell carcinoma cryoablation. Overall from a kidney standpoint he is stable. No evidence of abdominal pain, flank pain, dysuria or hematuria. No change in urinary tract symptoms. Over the last year, he did undergo 4 vessel coronary bypass at [facility] which he has recovered from. Stable appetite and weight. No current physical limitations. He returns to review surveillance imaging.  Past Medical History:  Diagnosis Date  . Bladder cancer ([place])   . BPH (benign prostatic hyperplasia)   . Myocardial infarction ([place]) 2009  . Right renal mass     Allergies: Patient has no known allergies.  Medications: Prior to Admission medications   Medication Sig Start Date End Date Taking? Authorizing Provider  amLODipine (NORVASC) 5 MG tablet Take 5 mg by mouth 2 (two) times daily.   Yes [provider]  aspirin 81 MG tablet Take 81 mg by mouth daily.   Yes [provider]  atorvastatin (LIPITOR) 40 MG tablet Take 40 mg by mouth daily.   Yes Pamala Hurry, MD  carvedilol (COREG) 6.25 MG tablet Take 6.25 mg by mouth 2 (two) times daily with a meal.   Yes [provider]  finasteride (PROSCAR) 5 MG tablet Take 5 mg by mouth daily.   Yes Pamala Hurry, MD  losartan (COZAAR) 100 MG tablet Take 100 mg by mouth daily.   Yes [provider]  cefdinir (OMNICEF) 300 MG capsule Take 300 mg by mouth 2 (two) times daily. Reported on 01/12/2016    Pamala Hurry, MD  ciprofloxacin (CIPRO) 500 MG tablet Take 500 mg by mouth 2 (two) times daily. Reported on 01/12/2016     Pamala Hurry, MD  HYDROcodone-acetaminophen (NORCO/VICODIN) 5-325 MG per tablet Take 1 tablet by mouth every 6 (six) hours as needed for moderate pain. Reported on 01/12/2016    Pamala Hurry, MD  lisinopril (PRINIVIL,ZESTRIL) 40 MG tablet Take 40 mg by mouth daily.    Pamala Hurry, MD     No family history on file.  Social History   Socioeconomic History  . Marital status: Married    Spouse name: None  . Number of children: None  . Years of education: None  . Highest education level: None  Social Needs  . Financial resource strain: None  . Food insecurity - worry: None  . Food insecurity - inability: None  . Transportation needs - medical: None  . Transportation needs - non-medical: None  Occupational History  . None  Tobacco Use  . Smoking status: Never Smoker  . Smokeless tobacco: Never Used  Substance and Sexual Activity  . Alcohol use: No    Alcohol/week: 0.0 oz  . Drug use: No  . Sexual activity: None  Other Topics Concern  . None  Social History Narrative  . None      Review of Systems: A 12 point ROS discussed and pertinent positives are indicated in the HPI above.  All other systems are negative.  Review of Systems  Vital Signs: BP 118/67   Pulse (!) 55  Temp 98.4 F (36.9 C) (Oral)   Resp 15   Ht 5\' 11"  (1.803 m)   Wt 220 lb (99.8 kg)   SpO2 97%   BMI 30.68 kg/m   Physical Exam  Constitutional: He is oriented to person, place, and time. He appears well-developed and well-nourished. No distress.  Eyes: Conjunctivae are normal. No scleral icterus.  Cardiovascular: Normal rate, regular rhythm and intact distal pulses.  Pulmonary/Chest: Effort normal and breath sounds normal. No respiratory distress.  Abdominal: Soft. Bowel sounds are normal. He exhibits no distension and no mass.  Neurological: He is alert and oriented to person, place, and time.  Skin: Skin is warm and dry. He is not diaphoretic.  Psychiatric: He has a normal mood  and affect.     Imaging: No results found.  Labs:  CBC: No results for input(s): WBC, HGB, HCT, PLT in the last 8760 hours.  COAGS: No results for input(s): INR, APTT in the last 8760 hours.  BMP: No results for input(s): NA, K, CL, CO2, GLUCOSE, BUN, CALCIUM, CREATININE, GFRNONAA, GFRAA in the last 8760 hours.  Invalid input(s): CMP  LIVER FUNCTION TESTS: No results for input(s): BILITOT, AST, ALT, ALKPHOS, PROT, ALBUMIN in the last 8760 hours.    Assessment and Plan:  3 year status post right renal cell carcinoma cryoablation. Surveillance imaging reviewed today. This demonstrates a stable ablation defect. No signs of residual or recurrent tumor. No new suspicious renal lesion is on either side. Overall, from a renal standpoint is doing very well and asymptomatic.  Plan: Continue annual surveillance with outpatient CT annually for total of 5 years.   Electronically Signed: Greggory Keen 01/25/2018, 1:46 PM   I spent a total of    25 Minutes in face to face in clinical consultation, greater than 50% of which was counseling/coordinating care for this patient with renal cell carcinoma status post cryoablation.

## 2018-02-05 DIAGNOSIS — J90 Pleural effusion, not elsewhere classified: Secondary | ICD-10-CM

## 2018-02-05 HISTORY — DX: Pleural effusion, not elsewhere classified: J90

## 2019-01-01 ENCOUNTER — Other Ambulatory Visit: Payer: Self-pay | Admitting: Radiology

## 2019-01-01 ENCOUNTER — Other Ambulatory Visit: Payer: Self-pay | Admitting: Interventional Radiology

## 2019-01-01 DIAGNOSIS — N2889 Other specified disorders of kidney and ureter: Secondary | ICD-10-CM

## 2020-05-27 DIAGNOSIS — M722 Plantar fascial fibromatosis: Secondary | ICD-10-CM

## 2020-05-27 DIAGNOSIS — R202 Paresthesia of skin: Secondary | ICD-10-CM | POA: Insufficient documentation

## 2020-05-27 DIAGNOSIS — S90821A Blister (nonthermal), right foot, initial encounter: Secondary | ICD-10-CM

## 2020-05-27 HISTORY — DX: Plantar fascial fibromatosis: M72.2

## 2020-05-27 HISTORY — DX: Blister (nonthermal), right foot, initial encounter: S90.821A

## 2020-05-27 HISTORY — DX: Paresthesia of skin: R20.2

## 2021-07-19 ENCOUNTER — Telehealth: Payer: Self-pay

## 2021-07-19 NOTE — Telephone Encounter (Signed)
Per [facility]. Unable to comply with our request because patient has no records with Dr. Otho Perl. Unable to reach the patient or LM.

## 2021-07-22 ENCOUNTER — Other Ambulatory Visit: Payer: Self-pay

## 2021-07-22 DIAGNOSIS — R39198 Other difficulties with micturition: Secondary | ICD-10-CM

## 2021-07-22 DIAGNOSIS — Z8551 Personal history of malignant neoplasm of bladder: Secondary | ICD-10-CM

## 2021-07-22 DIAGNOSIS — N9989 Other postprocedural complications and disorders of genitourinary system: Secondary | ICD-10-CM

## 2021-07-22 DIAGNOSIS — I739 Peripheral vascular disease, unspecified: Secondary | ICD-10-CM | POA: Insufficient documentation

## 2021-07-22 DIAGNOSIS — M1990 Unspecified osteoarthritis, unspecified site: Secondary | ICD-10-CM

## 2021-07-22 DIAGNOSIS — R351 Nocturia: Secondary | ICD-10-CM

## 2021-07-22 HISTORY — DX: Other difficulties with micturition: N99.89

## 2021-07-22 HISTORY — DX: Peripheral vascular disease, unspecified: I73.9

## 2021-07-22 HISTORY — DX: Personal history of malignant neoplasm of bladder: Z85.51

## 2021-07-22 HISTORY — DX: Unspecified osteoarthritis, unspecified site: M19.90

## 2021-07-22 HISTORY — DX: Nocturia: R35.1

## 2021-07-22 HISTORY — DX: Other difficulties with micturition: R39.198

## 2021-07-26 ENCOUNTER — Other Ambulatory Visit: Payer: Self-pay

## 2021-07-26 ENCOUNTER — Ambulatory Visit (INDEPENDENT_AMBULATORY_CARE_PROVIDER_SITE_OTHER): Payer: Medicare Other | Admitting: Cardiology

## 2021-07-26 ENCOUNTER — Encounter: Payer: Self-pay | Admitting: Cardiology

## 2021-07-26 VITALS — BP 128/62 | HR 51 | Ht 72.0 in | Wt 209.8 lb

## 2021-07-26 DIAGNOSIS — I714 Abdominal aortic aneurysm, without rupture, unspecified: Secondary | ICD-10-CM | POA: Insufficient documentation

## 2021-07-26 DIAGNOSIS — E782 Mixed hyperlipidemia: Secondary | ICD-10-CM | POA: Diagnosis not present

## 2021-07-26 DIAGNOSIS — E1165 Type 2 diabetes mellitus with hyperglycemia: Secondary | ICD-10-CM | POA: Insufficient documentation

## 2021-07-26 DIAGNOSIS — G629 Polyneuropathy, unspecified: Secondary | ICD-10-CM

## 2021-07-26 DIAGNOSIS — Z951 Presence of aortocoronary bypass graft: Secondary | ICD-10-CM | POA: Diagnosis not present

## 2021-07-26 DIAGNOSIS — I6523 Occlusion and stenosis of bilateral carotid arteries: Secondary | ICD-10-CM

## 2021-07-26 DIAGNOSIS — E559 Vitamin D deficiency, unspecified: Secondary | ICD-10-CM

## 2021-07-26 DIAGNOSIS — E039 Hypothyroidism, unspecified: Secondary | ICD-10-CM | POA: Insufficient documentation

## 2021-07-26 DIAGNOSIS — D518 Other vitamin B12 deficiency anemias: Secondary | ICD-10-CM | POA: Insufficient documentation

## 2021-07-26 DIAGNOSIS — E042 Nontoxic multinodular goiter: Secondary | ICD-10-CM

## 2021-07-26 DIAGNOSIS — Z79899 Other long term (current) drug therapy: Secondary | ICD-10-CM | POA: Insufficient documentation

## 2021-07-26 DIAGNOSIS — K219 Gastro-esophageal reflux disease without esophagitis: Secondary | ICD-10-CM | POA: Insufficient documentation

## 2021-07-26 DIAGNOSIS — C641 Malignant neoplasm of right kidney, except renal pelvis: Secondary | ICD-10-CM | POA: Diagnosis not present

## 2021-07-26 DIAGNOSIS — I10 Essential (primary) hypertension: Secondary | ICD-10-CM | POA: Diagnosis not present

## 2021-07-26 DIAGNOSIS — L237 Allergic contact dermatitis due to plants, except food: Secondary | ICD-10-CM

## 2021-07-26 DIAGNOSIS — I70213 Atherosclerosis of native arteries of extremities with intermittent claudication, bilateral legs: Secondary | ICD-10-CM | POA: Insufficient documentation

## 2021-07-26 HISTORY — DX: Allergic contact dermatitis due to plants, except food: L23.7

## 2021-07-26 HISTORY — DX: Vitamin D deficiency, unspecified: E55.9

## 2021-07-26 HISTORY — DX: Type 2 diabetes mellitus with hyperglycemia: E11.65

## 2021-07-26 HISTORY — DX: Other vitamin B12 deficiency anemias: D51.8

## 2021-07-26 HISTORY — DX: Gastro-esophageal reflux disease without esophagitis: K21.9

## 2021-07-26 HISTORY — DX: Hypothyroidism, unspecified: E03.9

## 2021-07-26 HISTORY — DX: Abdominal aortic aneurysm, without rupture, unspecified: I71.40

## 2021-07-26 HISTORY — DX: Occlusion and stenosis of bilateral carotid arteries: I65.23

## 2021-07-26 HISTORY — DX: Polyneuropathy, unspecified: G62.9

## 2021-07-26 HISTORY — DX: Atherosclerosis of native arteries of extremities with intermittent claudication, bilateral legs: I70.213

## 2021-07-26 HISTORY — DX: Nontoxic multinodular goiter: E04.2

## 2021-07-26 HISTORY — DX: Other long term (current) drug therapy: Z79.899

## 2021-07-26 LAB — LIPID PANEL
Chol/HDL Ratio: 6 ratio — ABNORMAL HIGH (ref 0.0–5.0)
Cholesterol, Total: 186 mg/dL (ref 100–199)
HDL: 31 mg/dL — ABNORMAL LOW (ref >39)
LDL Chol Calc (NIH): 135 mg/dL — ABNORMAL HIGH (ref 0–99)
Triglycerides: 109 mg/dL (ref 0–149)
VLDL Cholesterol Cal: 20 mg/dL (ref 5–40)

## 2021-07-26 NOTE — Patient Instructions (Signed)
Medication Instructions:  Your physician recommends that you continue on your current medications as directed. Please refer to the Current Medication list given to you today.  *If you need a refill on your cardiac medications before your next appointment, please call your pharmacy*   Lab Work: Your physician recommends that you return for lab work in: [place] today fasting If you have labs (blood work) drawn today and your tests are completely normal, you will receive your results only by: [place] (if you have MyChart) OR A paper copy in the mail If you have any lab test that is abnormal or we need to change your treatment, we will call you to review the results.   Testing/Procedures: Your physician has requested that you have an echocardiogram. Echocardiography is a painless test that uses sound waves to create images of your heart. It provides your doctor with information about the size and shape of your heart and how well your heart's chambers and valves are working. This procedure takes approximately one hour. There are no restrictions for this procedure.   Your physician has requested that you have an abdominal aorta duplex. During this test, an ultrasound is used to evaluate the aorta. Allow 30 minutes for this exam. Do not eat after midnight the day before and avoid carbonated beverages    Follow-Up: At [facility], you and your health needs are our priority.  As part of our continuing mission to provide you with exceptional heart care, we have created designated Provider Care Teams.  These Care Teams include your primary Cardiologist (physician) and Advanced Practice Providers (APPs -  Physician Assistants and Nurse Practitioners) who all work together to provide you with the care you need, when you need it.  We recommend signing up for the patient portal called "MyChart".  Sign up information is provided on this After Visit Summary.  MyChart is used to connect with  patients for Virtual Visits (Telemedicine).  Patients are able to view lab/test results, encounter notes, upcoming appointments, etc.  Non-urgent messages can be sent to your provider as well.   To learn more about what you can do with MyChart, go to [email or web address].    Your next appointment:   3 month(s)  The format for your next appointment:   In Person  Provider:   Jenne Campus, MD   Other Instructions

## 2021-07-26 NOTE — Addendum Note (Signed)
Addended by: Jerl Santos R on: 07/26/2021 12:02 PM   Modules accepted: Orders

## 2021-07-26 NOTE — Progress Notes (Signed)
Cardiology Consultation:    Date:  07/26/2021   ID:  Brady Singh, DOB [birth year], MRN [id number]  PCP:  Brady Brothers, MD  Cardiologist:  Jenne Campus, MD   Referring MD: Brady Brothers, MD   Chief Complaint  Patient presents with   Establish Care  I would like to be reestablished as a patient  History of Present Illness:    Brady Singh is a 70 y.o. male who is being seen today for the evaluation of coronary artery disease at the request of Brady Brothers, MD. with past medical history significant for coronary artery disease, in 2009 he did have PTCA and drug-eluting stent to right coronary artery and circumflex in face of inferior posterior myocardial infarction, then in 2018 he had a patent coronary artery bypass graft with LIMA to LAD SVG to diagonal 1 SVG to obtuse marginal 1 SVG to PDA.  Ejection fraction was 70% at that time, also episode paroxysmal atrial fibrillation apparently only after bypass surgery, she does have malignant right kidney cancer, essential hypertension, dyslipidemia, peripheral vascular disease. He comes today to my office to be reestablished as a patient I did see him years ago.  Denies have any chest pain tightness squeezing pressure burning chest.  Still very active and working his property have no difficulty doing it.  Denies have any chest pain tightness squeezing pressure burning chest there is no shortness of breath no swelling of lower extremities.  He does complain of having some neuropathic pain in his lower extremities no claudication.  Denies having palpitations. He does not smoke He is not on any diet Still active and works a lot but does not do any structured exercise program.  Past Medical History:  Diagnosis Date   Acute blood loss anemia 06/02/2017   Arthritis 07/22/2021   Formatting of this note might be different from the original. Generalized, hands the worse   Atherosclerotic heart disease of native coronary artery without angina pectoris  08/31/2015   Formatting of this note might be different from the original. Overview:  Drug-eluting stent to RCA and circumflex in September 2009 in face of the inferior wall myocardial infarction Formatting of this note might be different from the original. Drug-eluting stent to RCA and circumflex in September 2009 in face of the inferior wall myocardial infarction   Benign prostatic hyperplasia with urinary obstruction 09/24/2014   Bilateral carotid artery stenosis 03/28/2017   Bladder cancer ([place])    Blister of right foot 05/27/2020   Formatting of this note might be different from the original. Healing/resolving   BPH (benign prostatic hyperplasia)    Cancer ([place]) 11/28/2014   Formatting of this note might be different from the original. Renal cell CA   Coronary artery stenosis 10/13/2014   Formatting of this note might be different from the original. S/p Inf- post MI Sept 2009 PTCA DES to RCA and CX- Sept 2009   Disorder of male genital organs 10/13/2014   Epididymitis, right 10/13/2014   Essential hypertension 10/13/2014   Frequency of micturition 10/13/2014   Heart attack ([place]) 11/29/2007   Formatting of this note might be different from the original. two stents   History of bladder cancer 07/22/2021   History of left-sided carotid endarterectomy 11/23/2017   Hyperlipemia 10/13/2014   Left inguinal hernia 10/13/2014   Long-term use of aspirin therapy 11/23/2017   Malignant neoplasm of right kidney ([place]) 09/24/2014   Myocardial infarction [facility]) 2009   Nocturia 07/22/2021   Formatting of this  note might be different from the original. 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   Other specified disorders of kidney and ureter 12/03/2014   PAD (peripheral artery disease) ([place]) 07/22/2021   Paresthesia of both feet 05/27/2020   Peyronie's disease 10/13/2014   Plantar fasciitis of right foot 05/27/2020   Pleural effusion 02/05/2018   Postoperative atrial fibrillation ([place]) 06/07/2017    Postoperative voiding difficulty 07/22/2021   Pre-op evaluation 04/12/2017   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   Pure hypercholesterolemia 08/31/2015   Renal cell carcinoma, right ([place]) 12/29/2016   Renal mass, right 02/13/2015   Right renal mass    Spermatocele [id number]   Umbilical hernia without obstruction or gangrene 03/28/2017   Unstable angina ([place]) 05/29/2017    Past Surgical History:  Procedure Laterality Date   CORONARY ANGIOPLASTY     Coronary angio w/ stent placement x 2    HERNIA REPAIR  2013   Left Inguinal Hernia repair   IR GENERIC HISTORICAL  12/09/2014   IR RADIOLOGIST EVAL & MGMT 12/09/2014 Greggory Keen, MD [facility] INTERV RAD   IR RADIOLOGIST EVAL & MGMT  01/25/2018   TONSILLECTOMY     TRANSURETHRAL RESECTION OF BLADDER TUMOR  2007; 2010     VASECTOMY      Current Medications: Current Meds  Medication Sig   amLODipine (NORVASC) 5 MG tablet Take 1 tablet by mouth daily.   aspirin 325 MG EC tablet Take 1 tablet by mouth daily.   atorvastatin (LIPITOR) 80 MG tablet Take 80 mg by mouth daily.   carvedilol (COREG) 6.25 MG tablet Take 6.25 mg by mouth 2 (two) times daily with a meal.   gabapentin (NEURONTIN) 300 MG capsule Take 300 mg by mouth 3 (three) times daily.   hydrochlorothiazide (MICROZIDE) 12.5 MG capsule Take 25 mg by mouth 2 (two) times daily.   HYDROcodone-acetaminophen (NORCO/VICODIN) 5-325 MG per tablet Take 1 tablet by mouth every 6 (six) hours as needed for moderate pain. Reported on 01/12/2016   irbesartan (AVAPRO) 300 MG tablet Take 300 mg by mouth daily.   lisinopril (PRINIVIL,ZESTRIL) 40 MG tablet Take 40 mg by mouth daily.   losartan (COZAAR) 100 MG tablet Take 100 mg by mouth daily.   Melatonin 10 MG CAPS Take 2 tablets by mouth at bedtime.   olmesartan (BENICAR) 40 MG tablet Take 40 mg by mouth daily.   omeprazole (PRILOSEC) 40 MG capsule Take 40 mg by mouth daily.     Allergies:   Patient has no known allergies.   Social History    Socioeconomic History   Marital status: Married    Spouse name: Not on file   Number of children: Not on file   Years of education: Not on file   Highest education level: Not on file  Occupational History   Not on file  Tobacco Use   Smoking status: Never   Smokeless tobacco: Never  Substance and Sexual Activity   Alcohol use: No    Alcohol/week: 0.0 standard drinks   Drug use: No   Sexual activity: Yes  Other Topics Concern   Not on file  Social History Narrative   Not on file   Social Determinants of Health   Financial Resource Strain: Not on file  Food Insecurity: Not on file  Transportation Needs: Not on file  Physical Activity: Not on file  Stress: Not on file  Social Connections: Not on file     Family History: The patient's family history  is not on file. ROS:   Please see the history of present illness.    All 14 point review of systems negative except as described per history of present illness.  EKGs/Labs/Other Studies Reviewed:    The following studies were reviewed today: I did review record from artery cardiology practice  EKG:  EKG is  ordered today.  The ekg ordered today demonstrates sinus bradycardia rate 50, first-degree AV block, cannot rule out anterior myocardial infarction, nonspecific ST segment changes.  Recent Labs: No results found for requested labs within last 8760 hours.  Recent Lipid Panel No results found for: CHOL, TRIG, HDL, CHOLHDL, VLDL, LDLCALC, LDLDIRECT  Physical Exam:    VS:  BP 128/62 (BP Location: Left Arm, Patient Position: Sitting)   Pulse (!) 51   Ht 6' (1.829 m)   Wt 209 lb 12.8 oz (95.2 kg)   SpO2 98%   BMI 28.45 kg/m     Wt Readings from Last 3 Encounters:  07/26/21 209 lb 12.8 oz (95.2 kg)  01/25/18 220 lb (99.8 kg)  12/09/14 201 lb (91.2 kg)     GEN:  Well nourished, well developed in no acute distress HEENT: Normal NECK: No JVD; No carotid bruits LYMPHATICS: No lymphadenopathy CARDIAC: RRR, no  murmurs, no rubs, no gallops RESPIRATORY:  Clear to auscultation without rales, wheezing or rhonchi  ABDOMEN: Soft, non-tender, non-distended MUSCULOSKELETAL:  No edema; No deformity  SKIN: Warm and dry NEUROLOGIC:  Alert and oriented x 3 PSYCHIATRIC:  Normal affect   ASSESSMENT:    1. Status post coronary artery bypass graft LIMA to the LAD SVG to D1 SVG to OM1 SVG to PDA EF 70% 06/01/2017    2. Mixed hyperlipidemia   3. Malignant neoplasm of right kidney (H[facility])   4. Abdominal aortic aneurysm without rupture ([place])   5. Essential hypertension   6. Bilateral carotid artery stenosis    PLAN:    In order of problems listed above:  Status post coronary artery bypass graft seems to be asymptomatic we will continue present management which include antiplatelets therapy.  He is on aspirin which I will reduce dose from 325 to 81 mg daily. Mixed dyslipidemia.  He was taking Lipitor however he did see some doctor in [place] and he does not want to 9 this physician he told him that statins are very bad for him and he recommended to stop that medication.  He has been off statin for a while already.  I will check his fasting lipid profile today and explained to him that starting a very beneficial for somebody with coronary artery disease.  I told him also that I am most likely will restart statin. Abdominal aortic aneurysm.  We will schedule him to have abdominal arctic ultrasounds to look at the size of the aneurysm.  I will also schedule him to have echocardiogram to assess left ventricle ejection fraction.  He is EKG showing possibility of anterior wall myocardial infarction Bilateral carotic artery stenosis.  In the future we will schedule him to have carotic ultrasound.   Medication Adjustments/Labs and Tests Ordered: Current medicines are reviewed at length with the patient today.  Concerns regarding medicines are outlined above.  No orders of the defined types were placed in this  encounter.  No orders of the defined types were placed in this encounter.   Signed, Park Liter, MD, [facility]. 07/26/2021 9:22 AM    [place]

## 2021-07-26 NOTE — Addendum Note (Signed)
Addended by: Darrel Reach on: 07/26/2021 09:36 AM   Modules accepted: Orders

## 2021-07-30 MED ORDER — ATORVASTATIN CALCIUM 80 MG PO TABS: 80.0000 mg | ORAL_TABLET | Freq: Every day | ORAL | 3 refills | Status: DC

## 2021-07-30 NOTE — Addendum Note (Signed)
Addended by: Truddie Hidden on: 07/30/2021 08:07 AM   Modules accepted: Orders

## 2021-08-11 ENCOUNTER — Ambulatory Visit (INDEPENDENT_AMBULATORY_CARE_PROVIDER_SITE_OTHER): Payer: Medicare Other

## 2021-08-11 ENCOUNTER — Other Ambulatory Visit: Payer: Self-pay

## 2021-08-11 DIAGNOSIS — I714 Abdominal aortic aneurysm, without rupture, unspecified: Secondary | ICD-10-CM

## 2021-08-11 DIAGNOSIS — C641 Malignant neoplasm of right kidney, except renal pelvis: Secondary | ICD-10-CM

## 2021-08-11 DIAGNOSIS — Z951 Presence of aortocoronary bypass graft: Secondary | ICD-10-CM

## 2021-08-11 DIAGNOSIS — I6523 Occlusion and stenosis of bilateral carotid arteries: Secondary | ICD-10-CM

## 2021-08-11 DIAGNOSIS — I10 Essential (primary) hypertension: Secondary | ICD-10-CM

## 2021-08-11 DIAGNOSIS — E782 Mixed hyperlipidemia: Secondary | ICD-10-CM

## 2021-08-12 LAB — ECHOCARDIOGRAM COMPLETE
Area-P 1/2: 3.39 cm2
S' Lateral: 3.4 cm

## 2021-08-13 ENCOUNTER — Telehealth: Payer: Self-pay

## 2021-08-13 NOTE — Telephone Encounter (Signed)
-----   Message from Park Liter, MD sent at 08/13/2021  9:26 AM EDT ----- No evidence of abdominal arctic aneurysm

## 2021-08-13 NOTE — Telephone Encounter (Signed)
Left message on patients voicemail to please return our call.   

## 2021-08-13 NOTE — Telephone Encounter (Signed)
Spoke with patient regarding results and recommendation.  Patient verbalizes understanding and is agreeable to plan of care. Advised patient to call back with any issues or concerns.  

## 2021-08-13 NOTE — Telephone Encounter (Signed)
Patient is returning call.  °

## 2021-10-25 DIAGNOSIS — M545 Low back pain, unspecified: Secondary | ICD-10-CM

## 2021-10-25 HISTORY — DX: Low back pain, unspecified: M54.50

## 2021-11-01 DIAGNOSIS — C679 Malignant neoplasm of bladder, unspecified: Secondary | ICD-10-CM | POA: Insufficient documentation

## 2021-11-02 ENCOUNTER — Ambulatory Visit (INDEPENDENT_AMBULATORY_CARE_PROVIDER_SITE_OTHER): Payer: Medicare Other | Admitting: Cardiology

## 2021-11-02 ENCOUNTER — Encounter: Payer: Self-pay | Admitting: Cardiology

## 2021-11-02 ENCOUNTER — Telehealth: Payer: Self-pay | Admitting: Cardiology

## 2021-11-02 ENCOUNTER — Other Ambulatory Visit: Payer: Self-pay

## 2021-11-02 VITALS — BP 130/80 | HR 55 | Ht 72.0 in | Wt 219.0 lb

## 2021-11-02 DIAGNOSIS — Z951 Presence of aortocoronary bypass graft: Secondary | ICD-10-CM

## 2021-11-02 DIAGNOSIS — E7800 Pure hypercholesterolemia, unspecified: Secondary | ICD-10-CM | POA: Diagnosis not present

## 2021-11-02 DIAGNOSIS — I739 Peripheral vascular disease, unspecified: Secondary | ICD-10-CM

## 2021-11-02 NOTE — Addendum Note (Signed)
Addended by: Senaida Ores on: 11/02/2021 08:59 AM   Modules accepted: Orders

## 2021-11-02 NOTE — Progress Notes (Signed)
Cardiology Office Note:    Date:  11/02/2021   ID:  Brady Singh, DOB [birth year], MRN [id number]  PCP:  Garwin Brothers, MD  Cardiologist:  Jenne Campus, MD    Referring MD: Garwin Brothers, MD   Chief Complaint  Patient presents with   Follow-up  I am doing well  History of Present Illness:    Brady Singh is a 70 y.o. male   who is being seen today for the evaluation of coronary artery disease at the request of Garwin Brothers, MD. with past medical history significant for coronary artery disease, in 2009 he did have PTCA and drug-eluting stent to right coronary artery and circumflex in face of inferior posterior myocardial infarction, then in 2018 he had a patent coronary artery bypass graft with LIMA to LAD SVG to diagonal 1 SVG to obtuse marginal 1 SVG to PDA.  Ejection fraction was 70% at that time, also episode paroxysmal atrial fibrillation apparently only after bypass surgery, she does have malignant right kidney cancer, essential hypertension, dyslipidemia, peripheral vascular disease. Comes today to my office for follow-up.  Overall is doing very well.  Denies have any chest pain tightness squeezing pressure mid chest.  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  He is nondiabetic.  Cardiac wise doing well  Past Medical History:  Diagnosis Date   Acute blood loss anemia 06/02/2017   Arthritis 07/22/2021   Formatting of this note might be different from the original. Generalized, hands the worse   Atherosclerotic heart disease of native coronary artery without angina pectoris 08/31/2015   Formatting of this note might be different from the original. Overview:  Drug-eluting stent to RCA and circumflex in September 2009 in face of the inferior wall myocardial infarction Formatting of this note might be different from the original. Drug-eluting stent to RCA and circumflex in September 2009 in face of the inferior wall myocardial infarction   Benign  prostatic hyperplasia with urinary obstruction 09/24/2014   Bilateral carotid artery stenosis 03/28/2017   Bladder cancer ([place])    Blister of right foot 05/27/2020   Formatting of this note might be different from the original. Healing/resolving   BPH (benign prostatic hyperplasia)    Cancer ([place]) 11/28/2014   Formatting of this note might be different from the original. Renal cell CA   Coronary artery stenosis 10/13/2014   Formatting of this note might be different from the original. S/p Inf- post MI Sept 2009 PTCA DES to RCA and CX- Sept 2009   Disorder of male genital organs 10/13/2014   Epididymitis, right 10/13/2014   Essential hypertension 10/13/2014   Frequency of micturition 10/13/2014   Heart attack ([place]) 11/29/2007   Formatting of this note might be different from the original. two stents   History of bladder cancer 07/22/2021   History of left-sided carotid endarterectomy 11/23/2017   Hyperlipemia 10/13/2014   Left inguinal hernia 10/13/2014   Long-term use of aspirin therapy 11/23/2017   Malignant neoplasm of right kidney ([place]) 09/24/2014   Myocardial infarction [facility]) 2009   Nocturia 07/22/2021   Formatting of this note might be different from the original. 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   Other specified disorders of kidney and ureter 12/03/2014   PAD (peripheral artery disease) ([place]) 07/22/2021   Paresthesia of both feet 05/27/2020   Peyronie's disease 10/13/2014   Plantar fasciitis of right foot 05/27/2020   Pleural effusion 02/05/2018   Postoperative atrial fibrillation (  [place]) 06/07/2017   Postoperative voiding difficulty 07/22/2021   Pre-op evaluation 04/12/2017   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   Pure hypercholesterolemia 08/31/2015   Renal cell carcinoma, right ([place]) 12/29/2016   Renal mass, right 02/13/2015   Right renal mass    Spermatocele 28/36/6294   Umbilical hernia without obstruction or gangrene 03/28/2017   Unstable angina ([place]) 05/29/2017     Past Surgical History:  Procedure Laterality Date   CORONARY ANGIOPLASTY     Coronary angio w/ stent placement x 2    HERNIA REPAIR  2013   Left Inguinal Hernia repair   IR GENERIC HISTORICAL  12/09/2014   IR RADIOLOGIST EVAL & MGMT 12/09/2014 Greggory Keen, MD [facility] INTERV RAD   IR RADIOLOGIST EVAL & MGMT  01/25/2018   TONSILLECTOMY     TRANSURETHRAL RESECTION OF BLADDER TUMOR  2007; 2010     VASECTOMY      Current Medications: Current Meds  Medication Sig   amLODipine (NORVASC) 5 MG tablet Take 1 tablet by mouth daily.   aspirin 325 MG EC tablet Take 1 tablet by mouth daily.   atorvastatin (LIPITOR) 80 MG tablet Take 1 tablet (80 mg total) by mouth daily.   carvedilol (COREG) 12.5 MG tablet Take 12.5 mg by mouth 2 (two) times daily with a meal.   diltiazem (CARDIZEM CD) 120 MG 24 hr capsule Take 120 mg by mouth daily.   gabapentin (NEURONTIN) 300 MG capsule Take 300 mg by mouth 3 (three) times daily as needed (pain).   hydrochlorothiazide (MICROZIDE) 12.5 MG capsule Take 25 mg by mouth 2 (two) times daily.   HYDROcodone-acetaminophen (NORCO/VICODIN) 5-325 MG per tablet Take 1 tablet by mouth every 6 (six) hours as needed for moderate pain. Reported on 01/12/2016   lisinopril (PRINIVIL,ZESTRIL) 40 MG tablet Take 40 mg by mouth daily.   losartan (COZAAR) 100 MG tablet Take 100 mg by mouth daily.   Melatonin 10 MG CAPS Take 2 tablets by mouth at bedtime.   olmesartan (BENICAR) 40 MG tablet Take 40 mg by mouth daily.   omeprazole (PRILOSEC) 40 MG capsule Take 40 mg by mouth daily.   tamsulosin (FLOMAX) 0.4 MG CAPS capsule Take 0.4 mg by mouth daily.     Allergies:   Patient has no known allergies.   Social History   Socioeconomic History   Marital status: Married    Spouse name: Not on file   Number of children: Not on file   Years of education: Not on file   Highest education level: Not on file  Occupational History   Not on file  Tobacco Use   Smoking status: Never    Smokeless tobacco: Never  Substance and Sexual Activity   Alcohol use: No    Alcohol/week: 0.0 standard drinks   Drug use: No   Sexual activity: Yes  Other Topics Concern   Not on file  Social History Narrative   Not on file   Social Determinants of Health   Financial Resource Strain: Not on file  Food Insecurity: Not on file  Transportation Needs: Not on file  Physical Activity: Not on file  Stress: Not on file  Social Connections: Not on file     Family History: The patient's family history is not on file. ROS:   Please see the history of present illness.    All 14 point review of systems negative except as described per history of present illness  EKGs/Labs/Other Studies Reviewed:  Recent Labs: No results found for requested labs within last 8760 hours.  Recent Lipid Panel    Component Value Date/Time   CHOL 186 07/26/2021 0940   TRIG 109 07/26/2021 0940   HDL 31 (L) 07/26/2021 0940   CHOLHDL 6.0 (H) 07/26/2021 0940   LDLCALC 135 (H) 07/26/2021 0940    Physical Exam:    VS:  BP 130/80 (BP Location: Left Arm, Patient Position: Sitting, Cuff Size: Normal)   Pulse (!) 55   Ht 6' (1.829 m)   Wt 219 lb (99.3 kg)   SpO2 97%   BMI 29.70 kg/m     Wt Readings from Last 3 Encounters:  11/02/21 219 lb (99.3 kg)  07/26/21 209 lb 12.8 oz (95.2 kg)  01/25/18 220 lb (99.8 kg)     GEN:  Well nourished, well developed in no acute distress HEENT: Normal NECK: No JVD; No carotid bruits LYMPHATICS: No lymphadenopathy CARDIAC: RRR, no murmurs, no rubs, no gallops RESPIRATORY:  Clear to auscultation without rales, wheezing or rhonchi  ABDOMEN: Soft, non-tender, non-distended MUSCULOSKELETAL:  No edema; No deformity  SKIN: Warm and dry LOWER EXTREMITIES: no swelling NEUROLOGIC:  Alert and oriented x 3 PSYCHIATRIC:  Normal affect   ASSESSMENT:    1. Status post coronary artery bypass graft LIMA to the LAD SVG to D1 SVG to OM1 SVG to PDA EF 70% 06/01/2017    2.  Pure hypercholesterolemia   3. PAD (peripheral artery disease) (HCC)    PLAN:    In order of problems listed above:  Coronary disease status post coronary artery bypass graft.  Doing well from that point review asymptomatic.  Denies have any chest pain tightness squeezing pressure burning chest. Dyslipidemia I did review his K PN from summer he is HDL 31 LDL 135.  He was put on Lipitor 80.  We will check fasting lipid profile today Essential hypertension: Blood pressure well controlled we will continue present management. Neuropathy of lower extremities idiopathic.  Continue monitoring. Enlargement of the ascending aorta measuring 43 mm basilar based on echocardiogram, no need to intervene right now but to follow-up on the regular basis.   Medication Adjustments/Labs and Tests Ordered: Current medicines are reviewed at length with the patient today.  Concerns regarding medicines are outlined above.  No orders of the defined types were placed in this encounter.  Medication changes: No orders of the defined types were placed in this encounter.   Signed, Park Liter, MD, [facility] 11/02/2021 8:49 AM    [place]

## 2021-11-02 NOTE — Telephone Encounter (Signed)
Pt c/o medication issue:  1. Name of Medication:   2. How are you currently taking this medication (dosage and times per day)? No longer take medications  3. Are you having a reaction (difficulty breathing--STAT)? N/A  4. What is your medication issue? Patient wanted to let Dr. Agustin Cree know that he does not take the medications stated above anymore

## 2021-11-02 NOTE — Patient Instructions (Signed)
Medication Instructions:  Your physician recommends that you continue on your current medications as directed. Please refer to the Current Medication list given to you today.  *If you need a refill on your cardiac medications before your next appointment, please call your pharmacy*   Lab Work: Your physician recommends that you return for lab work today: lipid, lft  If you have labs (blood work) drawn today and your tests are completely normal, you will receive your results only by: MyChart Message (if you have MyChart) OR A paper copy in the mail If you have any lab test that is abnormal or we need to change your treatment, we will call you to review the results.   Testing/Procedures: None   Follow-Up: At [facility], you and your health needs are our priority.  As part of our continuing mission to provide you with exceptional heart care, we have created designated Provider Care Teams.  These Care Teams include your primary Cardiologist (physician) and Advanced Practice Providers (APPs -  Physician Assistants and Nurse Practitioners) who all work together to provide you with the care you need, when you need it.  We recommend signing up for the patient portal called "MyChart".  Sign up information is provided on this After Visit Summary.  MyChart is used to connect with patients for Virtual Visits (Telemedicine).  Patients are able to view lab/test results, encounter notes, upcoming appointments, etc.  Non-urgent messages can be sent to your provider as well.   To learn more about what you can do with MyChart, go to [email or web address].    Your next appointment:   6 month(s)  The format for your next appointment:   In Person  Provider:   Jenne Campus, MD    Other Instructions

## 2021-11-03 LAB — LIPID PANEL
Chol/HDL Ratio: 4.2 ratio (ref 0.0–5.0)
Cholesterol, Total: 143 mg/dL (ref 100–199)
HDL: 34 mg/dL — ABNORMAL LOW (ref >39)
LDL Chol Calc (NIH): 91 mg/dL (ref 0–99)
Triglycerides: 98 mg/dL (ref 0–149)
VLDL Cholesterol Cal: 18 mg/dL (ref 5–40)

## 2021-11-03 LAB — HEPATIC FUNCTION PANEL
ALT: 22 IU/L (ref 0–44)
AST: 20 IU/L (ref 0–40)
Albumin: 4.1 g/dL (ref 3.7–4.7)
Alkaline Phosphatase: 96 IU/L (ref 44–121)
Bilirubin Total: 0.7 mg/dL (ref 0.0–1.2)
Bilirubin, Direct: 0.19 mg/dL (ref 0.00–0.40)
Total Protein: 6.6 g/dL (ref 6.0–8.5)

## 2021-11-09 ENCOUNTER — Telehealth: Payer: Self-pay

## 2021-11-09 DIAGNOSIS — I10 Essential (primary) hypertension: Secondary | ICD-10-CM

## 2021-11-09 DIAGNOSIS — E782 Mixed hyperlipidemia: Secondary | ICD-10-CM

## 2021-11-09 DIAGNOSIS — E7800 Pure hypercholesterolemia, unspecified: Secondary | ICD-10-CM

## 2021-11-09 MED ORDER — EZETIMIBE 10 MG PO TABS: 10.0000 mg | ORAL_TABLET | Freq: Every day | ORAL | 2 refills | Status: DC

## 2021-11-09 NOTE — Telephone Encounter (Signed)
-----   Message from Park Liter, MD sent at 11/04/2021 10:16 AM EST ----- Cholesterol still not where it supposed to be.  Please are not Zetia 10 mg daily to medical regimen, fasting lipid profile need to be checked 6 weeks

## 2021-11-09 NOTE — Telephone Encounter (Signed)
Patient aware of results and recommendations and agreed with plan. Rx sent, order on file. Patient aware to be fasting for blood work

## 2021-12-08 ENCOUNTER — Other Ambulatory Visit: Payer: Self-pay

## 2021-12-08 DIAGNOSIS — E782 Mixed hyperlipidemia: Secondary | ICD-10-CM

## 2021-12-08 MED ORDER — AMLODIPINE BESYLATE 5 MG PO TABS: 5.0000 mg | ORAL_TABLET | Freq: Every day | ORAL | 1 refills | Status: DC

## 2021-12-08 NOTE — Telephone Encounter (Signed)
Amlodipine sent per [facility]  request . Ok per [facility]

## 2021-12-17 ENCOUNTER — Telehealth: Payer: Self-pay | Admitting: Cardiology

## 2021-12-17 MED ORDER — DILTIAZEM HCL ER COATED BEADS 120 MG PO CP24: 120.0000 mg | ORAL_CAPSULE | Freq: Every day | ORAL | 1 refills | Status: DC

## 2021-12-17 MED ORDER — HYDROCHLOROTHIAZIDE 12.5 MG PO CAPS: 25.0000 mg | ORAL_CAPSULE | Freq: Two times a day (BID) | ORAL | 1 refills | Status: DC

## 2021-12-17 MED ORDER — CARVEDILOL 12.5 MG PO TABS: 12.5000 mg | ORAL_TABLET | Freq: Two times a day (BID) | ORAL | 1 refills | Status: DC

## 2021-12-17 NOTE — Telephone Encounter (Signed)
Called patient to get more information. Patient is switching back to Dr. Agustin Cree to be his cardiologist and is requesting prescription refills. Checked with Dr. Agustin Cree if it was alright to refill the patients carvedilol, hydrochlorothiazide and diltiazem. Dr. Agustin Cree agreed to refilling the medications. Refills sent to pharmacy on file.

## 2021-12-17 NOTE — Telephone Encounter (Signed)
Wife came in today to let us know that there were a few prescriptions that her husbands last cardiologist prescribed that she would like for [place] to refill.  Best number to reach- [phone]  Thank you! Ammie Dalton

## 2021-12-29 ENCOUNTER — Telehealth: Payer: Self-pay

## 2021-12-29 NOTE — Telephone Encounter (Signed)
° °  Pre-operative Risk Assessment    Patient Name: Brady Singh  DOB: [birth year] MRN: [id number]      Request for Surgical Clearance    Procedure:   TRUS/Prostate Biopsy  Date of Surgery:  Clearance TBD                                 Surgeon:  Dr. Joie Bimler Surgeon's Group or Practice Name:  [facility] Phone number:  [phone] Fax number:  [phone]   Type of Clearance Requested:   - Medical    Type of Anesthesia:  Not Indicated   Additional requests/questions:    SignedLowella Grip   12/29/2021, 3:17 PM

## 2021-12-30 NOTE — Telephone Encounter (Signed)
° °  Name: Brady Singh  DOB: 12/31/1950  MRN: [id number]   Primary Cardiologist: Jenne Campus, MD  Chart reviewed as part of pre-operative protocol coverage. Patient was contacted 12/30/2021 in reference to pre-operative risk assessment for pending surgery as outlined below.  Brady Singh was last seen on 11/02/2021 by Dr. Agustin Cree.  Since that day, Brady Singh has done well without exertional chest pain worsening dyspnea.  He has no problem accomplishing more than 4 METS of activity.  Therefore, based on ACC/AHA guidelines, the patient would be at acceptable risk for the planned procedure without further cardiovascular testing.   The patient was advised that if he develops new symptoms prior to surgery to contact our office to arrange for a follow-up visit, and he verbalized understanding.  I will route this recommendation to the requesting party via Epic fax function and remove from pre-op pool. Please call with questions.  [place], [place] 12/30/2021, 10:53 AM

## 2022-01-10 ENCOUNTER — Telehealth: Payer: Self-pay | Admitting: Cardiology

## 2022-01-10 NOTE — Telephone Encounter (Signed)
Left message that we originally faxed clearance 12/29/21. Per Melina Copa, PAC she did re-fax the clearance today to fax # [phone] that was provided to our office. I will re-fax clearance notes as well myself. Our office was unaware that the requesting office had not received the clearance until today's date which is also the procedure date for the pt.

## 2022-01-10 NOTE — Telephone Encounter (Signed)
Brady Singh from [facility] calling to  request clearance be faxed again, because they did not receive it. Original Clearance phone note 12/29/2021. Procedure is today. Fax: [phone]

## 2022-01-10 NOTE — Telephone Encounter (Signed)
I will fax again but will route to callback to please call urology to ensure they received fax.

## 2022-01-11 ENCOUNTER — Telehealth: Payer: Self-pay | Admitting: Cardiology

## 2022-01-11 NOTE — Telephone Encounter (Signed)
I will refax completed pre-op risk assessment to request fax number below. Will remove from pre-op pool.  Darreld Mclean, PA-C 01/11/2022 12:39 PM

## 2022-01-11 NOTE — Telephone Encounter (Signed)
I s/w Brady Singh and asked if they had another fax # we could try faxing to, as they do not seem to be receiving the fax sent to [phone].   Brady Singh, did state that the [phone] fax # will also go through the computer system. I assured Brady Singh that I will fax to the [phone] as well. Brady Singh thanked me for the help.

## 2022-01-11 NOTE — Telephone Encounter (Signed)
Follow Up:      I have Claiborne Billings calling from [facility] she said they still have not received clearance for this patient.. Please resend to [phone]. The notes said it was refaxed yesterday, but she says they still do not have it,ollow Up:

## 2022-02-06 ENCOUNTER — Other Ambulatory Visit: Payer: Self-pay | Admitting: Cardiology

## 2022-05-10 ENCOUNTER — Ambulatory Visit: Payer: Medicare Other | Admitting: Cardiology

## 2022-05-26 ENCOUNTER — Other Ambulatory Visit: Payer: Self-pay | Admitting: Cardiology

## 2022-05-26 NOTE — Telephone Encounter (Signed)
Rx refill sent to pharmacy. 

## 2022-06-06 ENCOUNTER — Other Ambulatory Visit: Payer: Self-pay | Admitting: Cardiology

## 2022-07-30 ENCOUNTER — Other Ambulatory Visit: Payer: Self-pay | Admitting: Cardiology

## 2022-08-25 DIAGNOSIS — E169 Disorder of pancreatic internal secretion, unspecified: Secondary | ICD-10-CM

## 2022-08-25 DIAGNOSIS — G4700 Insomnia, unspecified: Secondary | ICD-10-CM

## 2022-08-25 HISTORY — DX: Insomnia, unspecified: G47.00

## 2022-08-25 HISTORY — DX: Disorder of pancreatic internal secretion, unspecified: E16.9

## 2022-08-29 ENCOUNTER — Ambulatory Visit: Payer: Medicare Other | Attending: Cardiology | Admitting: Cardiology

## 2022-08-29 ENCOUNTER — Encounter: Payer: Self-pay | Admitting: Cardiology

## 2022-08-29 VITALS — BP 136/78 | HR 62 | Ht 72.0 in | Wt 230.0 lb

## 2022-08-29 DIAGNOSIS — I480 Paroxysmal atrial fibrillation: Secondary | ICD-10-CM | POA: Diagnosis present

## 2022-08-29 DIAGNOSIS — Z951 Presence of aortocoronary bypass graft: Secondary | ICD-10-CM | POA: Diagnosis present

## 2022-08-29 DIAGNOSIS — E785 Hyperlipidemia, unspecified: Secondary | ICD-10-CM

## 2022-08-29 DIAGNOSIS — I7121 Aneurysm of the ascending aorta, without rupture: Secondary | ICD-10-CM

## 2022-08-29 HISTORY — DX: Aneurysm of the ascending aorta, without rupture: I71.21

## 2022-08-29 MED ORDER — AMLODIPINE BESYLATE 10 MG PO TABS: 10.0000 mg | ORAL_TABLET | Freq: Every day | ORAL | 3 refills | Status: DC

## 2022-08-29 NOTE — Addendum Note (Signed)
Addended by: Jacobo Forest D on: 08/29/2022 10:07 AM   Modules accepted: Orders

## 2022-08-29 NOTE — Progress Notes (Signed)
Cardiology Office Note:    Date:  08/29/2022   ID:  Brady Singh, DOB 03/25/1951, MRN [id number]  PCP:  Garwin Brothers, MD  Cardiologist:  Jenne Campus, MD    Referring MD: Garwin Brothers, MD   Chief Complaint  Patient presents with   Follow-up  Doing well  History of Present Illness:    Brady Singh is a 71 y.o. male with past medical history significant for coronary artery disease in 2009 he did have PTCA and drug-eluting stent to the right coronary artery and circumflex artery in face of acute inferior posterior wall myocardial infarction.  In 2019 he did have another cardiac catheterization was find to have significant valvular three-vessel disease and coronary bypass graft has been performed with LIMA to LAD, SVG to diagonal 1, SVG to obtuse marginal branch and SVG to PDA.  Ejection fraction at that time was 70%.  He had episode of atrial fibrillation around surgical time however after there are no more recurrences of arrhythmia.  Also history of malignant right kidney cancer, essential hypertension, dyslipidemia. He comes today to my office for follow-up.  Overall he is doing very well.  Denies having chest pain tightness squeezing pressure burning chest.  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  Past Medical History:  Diagnosis Date   Acute blood loss anemia 06/02/2017   Arthritis 07/22/2021   Formatting of this note might be different from the original. Generalized, hands the worse   Atherosclerotic heart disease of native coronary artery without angina pectoris 08/31/2015   Formatting of this note might be different from the original. Overview:  Drug-eluting stent to RCA and circumflex in September 2009 in face of the inferior wall myocardial infarction Formatting of this note might be different from the original. Drug-eluting stent to RCA and circumflex in September 2009 in face of the inferior wall myocardial infarction   Benign prostatic hyperplasia with urinary obstruction  09/24/2014   Bilateral carotid artery stenosis 03/28/2017   Bladder cancer ([place])    Blister of right foot 05/27/2020   Formatting of this note might be different from the original. Healing/resolving   BPH (benign prostatic hyperplasia)    Cancer ([place]) 11/28/2014   Formatting of this note might be different from the original. Renal cell CA   Coronary artery stenosis 10/13/2014   Formatting of this note might be different from the original. S/p Inf- post MI Sept 2009 PTCA DES to RCA and CX- Sept 2009   Disorder of male genital organs 10/13/2014   Epididymitis, right 10/13/2014   Essential hypertension 10/13/2014   Frequency of micturition 10/13/2014   Heart attack ([place]) 11/29/2007   Formatting of this note might be different from the original. two stents   History of bladder cancer 07/22/2021   History of left-sided carotid endarterectomy 11/23/2017   Hyperlipemia 10/13/2014   Left inguinal hernia 10/13/2014   Long-term use of aspirin therapy 11/23/2017   Malignant neoplasm of right kidney ([place]) 09/24/2014   Myocardial infarction [facility]) 2009   Nocturia 07/22/2021   Formatting of this note might be different from the original. 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   Other specified disorders of kidney and ureter 12/03/2014   PAD (peripheral artery disease) ([place]) 07/22/2021   Paresthesia of both feet 05/27/2020   Peyronie's disease 10/13/2014   Plantar fasciitis of right foot 05/27/2020   Pleural effusion 02/05/2018   Postoperative atrial fibrillation ([place]) 06/07/2017   Postoperative voiding difficulty 07/22/2021   Pre-op evaluation  04/12/2017   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   Pure hypercholesterolemia 08/31/2015   Renal cell carcinoma, right ([place]) 12/29/2016   Renal mass, right 02/13/2015   Right renal mass    Spermatocele 27/78/2423   Umbilical hernia without obstruction or gangrene 03/28/2017   Unstable angina ([place]) 05/29/2017    Past Surgical History:  Procedure  Laterality Date   CORONARY ANGIOPLASTY     Coronary angio w/ stent placement x 2    HERNIA REPAIR  2013   Left Inguinal Hernia repair   IR GENERIC HISTORICAL  12/09/2014   IR RADIOLOGIST EVAL & MGMT 12/09/2014 Greggory Keen, MD [facility] INTERV RAD   IR RADIOLOGIST EVAL & MGMT  01/25/2018   TONSILLECTOMY     TRANSURETHRAL RESECTION OF BLADDER TUMOR  2007; 2010     VASECTOMY      Current Medications: Current Meds  Medication Sig   amLODipine (NORVASC) 5 MG tablet TAKE 1 TABLET (5 MG TOTAL) BY MOUTH DAILY.   aspirin 325 MG EC tablet Take 1 tablet by mouth daily.   carvedilol (COREG) 12.5 MG tablet TAKE 1 TABLET (12.'5MG'$  TOTAL) BY MOUTH TWICE A DAY WITH MEALS (Patient taking differently: Take 12.5 mg by mouth 2 (two) times daily with a meal.)   diltiazem (CARDIZEM CD) 120 MG 24 hr capsule TAKE 1 CAPSULE BY MOUTH EVERY DAY (Patient taking differently: Take 120 mg by mouth daily.)   gabapentin (NEURONTIN) 300 MG capsule Take 300 mg by mouth 3 (three) times daily as needed (pain).   hydrochlorothiazide (MICROZIDE) 12.5 MG capsule Take 2 capsules (25 mg total) by mouth 2 (two) times daily.   lisinopril (PRINIVIL,ZESTRIL) 40 MG tablet Take 40 mg by mouth daily.   omeprazole (PRILOSEC) 40 MG capsule Take 40 mg by mouth daily.   tamsulosin (FLOMAX) 0.4 MG CAPS capsule Take 0.4 mg by mouth daily.   [DISCONTINUED] atorvastatin (LIPITOR) 80 MG tablet Take 1 tablet (80 mg total) by mouth daily.   [DISCONTINUED] ezetimibe (ZETIA) 10 MG tablet TAKE 1 TABLET BY MOUTH EVERY DAY (Patient taking differently: Take 10 mg by mouth daily.)   [DISCONTINUED] HYDROcodone-acetaminophen (NORCO/VICODIN) 5-325 MG per tablet Take 1 tablet by mouth every 6 (six) hours as needed for moderate pain. Reported on 01/12/2016   [DISCONTINUED] losartan (COZAAR) 100 MG tablet Take 100 mg by mouth daily.   [DISCONTINUED] Melatonin 10 MG CAPS Take 2 tablets by mouth at bedtime.   [DISCONTINUED] olmesartan (BENICAR) 40 MG tablet Take 40  mg by mouth daily.     Allergies:   Patient has no known allergies.   Social History   Socioeconomic History   Marital status: Married    Spouse name: Not on file   Number of children: Not on file   Years of education: Not on file   Highest education level: Not on file  Occupational History   Not on file  Tobacco Use   Smoking status: Never   Smokeless tobacco: Never  Substance and Sexual Activity   Alcohol use: No    Alcohol/week: 0.0 standard drinks of alcohol   Drug use: No   Sexual activity: Yes  Other Topics Concern   Not on file  Social History Narrative   Not on file   Social Determinants of Health   Financial Resource Strain: Not on file  Food Insecurity: Not on file  Transportation Needs: Not on file  Physical Activity: Not on file  Stress: Not on file  Social Connections: Not on file  Family History: The patient's family history is not on file. ROS:   Please see the history of present illness.    All 14 point review of systems negative except as described per history of present illness  EKGs/Labs/Other Studies Reviewed:      Recent Labs: 11/02/2021: ALT 22  Recent Lipid Panel    Component Value Date/Time   CHOL 143 11/02/2021 0901   TRIG 98 11/02/2021 0901   HDL 34 (L) 11/02/2021 0901   CHOLHDL 4.2 11/02/2021 0901   LDLCALC 91 11/02/2021 0901    Physical Exam:    VS:  BP 136/78 (BP Location: Left Arm, Patient Position: Sitting)   Pulse 62   Ht 6' (1.829 m)   Wt 230 lb (104.3 kg)   SpO2 98%   BMI 31.19 kg/m     Wt Readings from Last 3 Encounters:  08/29/22 230 lb (104.3 kg)  11/02/21 219 lb (99.3 kg)  07/26/21 209 lb 12.8 oz (95.2 kg)     GEN:  Well nourished, well developed in no acute distress HEENT: Normal NECK: No JVD; No carotid bruits LYMPHATICS: No lymphadenopathy CARDIAC: RRR, no murmurs, no rubs, no gallops RESPIRATORY:  Clear to auscultation without rales, wheezing or rhonchi  ABDOMEN: Soft, non-tender,  non-distended MUSCULOSKELETAL:  No edema; No deformity  SKIN: Warm and dry LOWER EXTREMITIES: no swelling NEUROLOGIC:  Alert and oriented x 3 PSYCHIATRIC:  Normal affect   ASSESSMENT:    1. Status post coronary artery bypass graft LIMA to the LAD SVG to D1 SVG to OM1 SVG to PDA EF 70% 06/01/2017    2. Paroxysmal atrial fibrillation (HCC)   3. Aneurysm of ascending aorta without rupture (HCC)    PLAN:    In order of problems listed above:  Status post coronary bypass graft doing well from that point review, on antiplatelet therapy which I will continue. Paroxysmal atrial fibrillation only episode after coronary bypass graft no more recurrences, not anticoagulated.  Denies have any palpitations Ascending aortic aneurysm measuring 43 mm.  We will repeat echocardiogram. Dyslipidemia he used to be taking Lipitor 80 however he have a lot of muscle ache he discontinued this medication does not want to take any statin.  I will ask him to have fasting lipid profile done today and then after that most likely he will be referred to our lipid clinic for consideration of PCSK9 agent Overall so I think he is doing very well.  We will continue present management.   Medication Adjustments/Labs and Tests Ordered: Current medicines are reviewed at length with the patient today.  Concerns regarding medicines are outlined above.  No orders of the defined types were placed in this encounter.  Medication changes: No orders of the defined types were placed in this encounter.   Signed, Park Liter, MD, [facility] 08/29/2022 9:44 AM    [place]

## 2022-08-29 NOTE — Patient Instructions (Addendum)
Medication Instructions:  Your physician has recommended you make the following change in your medication:   INCREASE: Amlodipine to '10mg'$  daily-  you may double up on your current dose and your next dose will be '10mg'$  tablets.   STOP: Cardizem   Lab Work: Lipid panel- Today If you have labs (blood work) drawn today and your tests are completely normal, you will receive your results only by: [place] (if you have MyChart) OR A paper copy in the mail If you have any lab test that is abnormal or we need to change your treatment, we will call you to review the results.   Testing/Procedures: Your physician has requested that you have an echocardiogram. Echocardiography is a painless test that uses sound waves to create images of your heart. It provides your doctor with information about the size and shape of your heart and how well your heart's chambers and valves are working. This procedure takes approximately one hour. There are no restrictions for this procedure.    Follow-Up: At [facility], you and your health needs are our priority.  As part of our continuing mission to provide you with exceptional heart care, we have created designated Provider Care Teams.  These Care Teams include your primary Cardiologist (physician) and Advanced Practice Providers (APPs -  Physician Assistants and Nurse Practitioners) who all work together to provide you with the care you need, when you need it.  We recommend signing up for the patient portal called "MyChart".  Sign up information is provided on this After Visit Summary.  MyChart is used to connect with patients for Virtual Visits (Telemedicine).  Patients are able to view lab/test results, encounter notes, upcoming appointments, etc.  Non-urgent messages can be sent to your provider as well.   To learn more about what you can do with MyChart, go to [email or web address].    Your next appointment:   12 month(s)  The format for your next  appointment:   In Person  Provider:   Jenne Campus, MD    Other Instructions NA

## 2022-08-30 LAB — LIPID PANEL
Chol/HDL Ratio: 6.4 ratio — ABNORMAL HIGH (ref 0.0–5.0)
Cholesterol, Total: 192 mg/dL (ref 100–199)
HDL: 30 mg/dL — ABNORMAL LOW (ref >39)
LDL Chol Calc (NIH): 128 mg/dL — ABNORMAL HIGH (ref 0–99)
Triglycerides: 190 mg/dL — ABNORMAL HIGH (ref 0–149)
VLDL Cholesterol Cal: 34 mg/dL (ref 5–40)

## 2022-08-31 ENCOUNTER — Telehealth: Payer: Self-pay

## 2022-08-31 DIAGNOSIS — E785 Hyperlipidemia, unspecified: Secondary | ICD-10-CM

## 2022-08-31 NOTE — Telephone Encounter (Signed)
Spoke with pt about Cholesterol results. Will refer to Lipid Clinic per Dr. Wendy Poet note. Pt agreed and verbalized understanding.

## 2022-09-01 ENCOUNTER — Other Ambulatory Visit: Payer: Self-pay | Admitting: Cardiology

## 2022-09-01 NOTE — Telephone Encounter (Signed)
Refill to pharmacy 

## 2022-09-06 ENCOUNTER — Telehealth: Payer: Self-pay | Admitting: Pharmacist

## 2022-09-06 ENCOUNTER — Ambulatory Visit: Payer: Medicare Other | Attending: Cardiovascular Disease | Admitting: Pharmacist

## 2022-09-06 ENCOUNTER — Encounter: Payer: Self-pay | Admitting: Pharmacist

## 2022-09-06 DIAGNOSIS — E782 Mixed hyperlipidemia: Secondary | ICD-10-CM | POA: Diagnosis not present

## 2022-09-06 DIAGNOSIS — I70213 Atherosclerosis of native arteries of extremities with intermittent claudication, bilateral legs: Secondary | ICD-10-CM | POA: Diagnosis not present

## 2022-09-06 DIAGNOSIS — M791 Myalgia, unspecified site: Secondary | ICD-10-CM

## 2022-09-06 DIAGNOSIS — I219 Acute myocardial infarction, unspecified: Secondary | ICD-10-CM

## 2022-09-06 DIAGNOSIS — I739 Peripheral vascular disease, unspecified: Secondary | ICD-10-CM | POA: Diagnosis not present

## 2022-09-06 DIAGNOSIS — T466X5A Adverse effect of antihyperlipidemic and antiarteriosclerotic drugs, initial encounter: Secondary | ICD-10-CM

## 2022-09-06 HISTORY — DX: Myalgia, unspecified site: M79.10

## 2022-09-06 MED ORDER — PRALUENT 75 MG/ML ~~LOC~~ SOAJ: 1.0000 mL | SUBCUTANEOUS | 11 refills | Status: DC

## 2022-09-06 NOTE — Telephone Encounter (Signed)
PA for Praluent submitted. Key : [id number]

## 2022-09-06 NOTE — Telephone Encounter (Addendum)
PA approved. No end date given. Called patient and lmom

## 2022-09-06 NOTE — Addendum Note (Signed)
Addended by: Rollen Sox on: 09/06/2022 01:29 PM   Modules accepted: Orders

## 2022-09-06 NOTE — Progress Notes (Signed)
Patient ID: Brady Singh                 DOB: 06-Jul-1951                    MRN: [id number]      HPI: Brady Singh is a 71 y.o. male patient referred to lipid clinic by Dr Agustin Cree. PMH is significant for MI, CAD, PAD, intermittent claudication, A Fib, renal cancer, and HLD. Previously managed on atorvastatin '80mg'$  which he discontinued due to myalgias.  Patient presents today with wife. Complains of neuropathy and leg pain. Denies chest pain or SOB. Has not been exercising much due to pain but is joining a gym shortly.  Scheduled for echo tomorrow  Current Medications: N/A  Intolerances:  Atorvastatin Zetia  Risk Factors:  CAD PAD Hx of MI  LDL goal: <70  Labs: TC 192, Trigs 190, HDL 30, LDL 128 (08/29/22)  Past Medical History:  Diagnosis Date   Acute blood loss anemia 06/02/2017   Arthritis 07/22/2021   Formatting of this note might be different from the original. Generalized, hands the worse   Atherosclerotic heart disease of native coronary artery without angina pectoris 08/31/2015   Formatting of this note might be different from the original. Overview:  Drug-eluting stent to RCA and circumflex in September 2009 in face of the inferior wall myocardial infarction Formatting of this note might be different from the original. Drug-eluting stent to RCA and circumflex in September 2009 in face of the inferior wall myocardial infarction   Benign prostatic hyperplasia with urinary obstruction 09/24/2014   Bilateral carotid artery stenosis 03/28/2017   Bladder cancer ([place])    Blister of right foot 05/27/2020   Formatting of this note might be different from the original. Healing/resolving   BPH (benign prostatic hyperplasia)    Cancer ([place]) 11/28/2014   Formatting of this note might be different from the original. Renal cell CA   Coronary artery stenosis 10/13/2014   Formatting of this note might be different from the original. S/p Inf- post MI Sept 2009 PTCA DES to RCA and CX-  Sept 2009   Disorder of male genital organs 10/13/2014   Epididymitis, right 10/13/2014   Essential hypertension 10/13/2014   Frequency of micturition 10/13/2014   Heart attack ([place]) 11/29/2007   Formatting of this note might be different from the original. two stents   History of bladder cancer 07/22/2021   History of left-sided carotid endarterectomy 11/23/2017   Hyperlipemia 10/13/2014   Left inguinal hernia 10/13/2014   Long-term use of aspirin therapy 11/23/2017   Malignant neoplasm of right kidney ([place]) 09/24/2014   Myocardial infarction [facility]) 2009   Nocturia 07/22/2021   Formatting of this note might be different from the original. 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   Other specified disorders of kidney and ureter 12/03/2014   PAD (peripheral artery disease) ([place]) 07/22/2021   Paresthesia of both feet 05/27/2020   Peyronie's disease 10/13/2014   Plantar fasciitis of right foot 05/27/2020   Pleural effusion 02/05/2018   Postoperative atrial fibrillation ([place]) 06/07/2017   Postoperative voiding difficulty 07/22/2021   Pre-op evaluation 04/12/2017   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   Pure hypercholesterolemia 08/31/2015   Renal cell carcinoma, right ([place]) 12/29/2016   Renal mass, right 02/13/2015   Right renal mass    Spermatocele 53/29/9242   Umbilical hernia without obstruction or gangrene 03/28/2017   Unstable angina ([place]) 05/29/2017  Current Outpatient Medications on File Prior to Visit  Medication Sig Dispense Refill   amLODipine (NORVASC) 10 MG tablet Take 1 tablet (10 mg total) by mouth daily. 90 tablet 3   aspirin 325 MG EC tablet Take 1 tablet by mouth daily.     carvedilol (COREG) 12.5 MG tablet TAKE 1 TABLET (12.'5MG'$  TOTAL) BY MOUTH TWICE A DAY WITH MEALS (Patient taking differently: Take 12.5 mg by mouth 2 (two) times daily with a meal.) 180 tablet 0   gabapentin (NEURONTIN) 300 MG capsule Take 300 mg by mouth 3 (three) times daily as needed (pain).      hydrochlorothiazide (MICROZIDE) 12.5 MG capsule TAKE 2 CAPSULES (25 MG TOTAL) BY MOUTH 2 (TWO) TIMES DAILY. 180 capsule 1   lisinopril (PRINIVIL,ZESTRIL) 40 MG tablet Take 40 mg by mouth daily.     omeprazole (PRILOSEC) 40 MG capsule Take 40 mg by mouth daily.     tamsulosin (FLOMAX) 0.4 MG CAPS capsule Take 0.4 mg by mouth daily.     No current facility-administered medications on file prior to visit.    No Known Allergies  Assessment/Plan:  1. Hyperlipidemia - Patient recent LDL 128 which is above goal of <70. Due to statin intolerance and comorbidities, recommend patient start on PCSK9i.  Using demo pen, educated patient on mechanism of action, storage, site selection, administration, and possible adverse effects. Will complete PA and contact patient when approved. Patient will update lipid panel in 2-3 months in [place].  Karren Cobble, PharmD, BCACP, [place], [place], [place], [place], [address] Phone: [phone], Fax: [phone]

## 2022-09-06 NOTE — Patient Instructions (Signed)
It was nice meeting you two today  We would like your LDL (bad cholesterol) to be less than 70  We would like to start you on a new medication called Repatha or Praluent. Both are given once every 2 weeks  I will complete the prior authorization for you and contact you when it is approved  Once you start the medication we will recheck your cholesterol in 2-3 months  Please call with any questions  Karren Cobble, PharmD, [place], [place], [place], [place], [place], [address] Phone: [phone], Fax: [phone]

## 2022-09-07 ENCOUNTER — Ambulatory Visit: Payer: Medicare Other | Attending: Cardiology

## 2022-09-07 DIAGNOSIS — I7121 Aneurysm of the ascending aorta, without rupture: Secondary | ICD-10-CM | POA: Diagnosis present

## 2022-09-07 LAB — ECHOCARDIOGRAM COMPLETE
Area-P 1/2: 4.89 cm2
S' Lateral: 3.6 cm

## 2022-09-12 ENCOUNTER — Telehealth: Payer: Self-pay | Admitting: Cardiology

## 2022-09-12 NOTE — Telephone Encounter (Signed)
Pt c/o medication issue:  1. Name of Medication: Alirocumab ([place]) 75 MG/ML SOAJ  2. How are you currently taking this medication (dosage and times per day)? : Inject 1 mL into the skin every 14 (fourteen) days. - Subcutaneous  3. Are you having a reaction (difficulty breathing--STAT)?   4. What is your medication issue?  Pt wife states that pharmacy is telling pt this medication is going to be $500, however pa was approved. Please advise

## 2022-09-13 NOTE — Telephone Encounter (Signed)
More than likely patient has a deductible that he has not met. He actually could get Leqvio for potentially a cheaper cost.  [place] spoke with patient who would like to try Leqvio. Start form sent to service center. See phone note from 10/23 from [facility].

## 2022-09-15 ENCOUNTER — Telehealth: Payer: Self-pay

## 2022-09-15 NOTE — Telephone Encounter (Signed)
Results reviewed with pt as per Dr. Krasowski's note.  Pt verbalized understanding and had no additional questions. Routed to PCP  

## 2022-09-19 ENCOUNTER — Telehealth: Payer: Self-pay | Admitting: Pharmacist

## 2022-09-19 NOTE — Telephone Encounter (Signed)
Spoke to patient via telephone regarding Praluent copay. Informed him that Praluent is expensive at this time due to his deductible and should decrease in price after the deductible is met. Also informed him that he may be a good candidate for Leqvio since he has supplemental insurance. He is interested in [place] and requested the proper forms requiring signature be emailed to his wife at [email or web address]. Questions were answered.  Thank you for allowing pharmacy to participate in this patient's care.  Reatha Harps, PharmD PGY2 Pharmacy Resident 09/19/2022 2:06 PM

## 2022-09-29 ENCOUNTER — Other Ambulatory Visit: Payer: Self-pay | Admitting: Pharmacist

## 2022-09-29 NOTE — Telephone Encounter (Signed)
Brady Singh is covered 80% by medicare part B. Patient BCBS plan F will cover the other 20% and his deductible.  Patient made aware. Will refer to infusion center for injection. Aware that someone from [facility] infusion center will reach out to them to schedule.

## 2022-09-30 ENCOUNTER — Telehealth: Payer: Self-pay | Admitting: Pharmacy Technician

## 2022-09-30 NOTE — Telephone Encounter (Addendum)
Dr. Sharol Harness, [facility] [place] note:  Auth Submission: NO AUTH NEEDED Payer: MEDICARE A/B & BCBS SUPP Medication & CPT/J Code(s) submitted: Leqvio ([place]) J1306 Route of submission (phone, fax, portal):  Phone [phone] Fax # Auth type: Buy/Bill Units/visits requested: X2  Reference number: [id number] Approval from: 09/30/22 to 10/01/23   Medicare will cover 80% and BCBS supp will pick-up remaining 20%. Medicare deductible has been met $226  Patient will be scheduled as soon as possible.

## 2022-10-10 ENCOUNTER — Ambulatory Visit (INDEPENDENT_AMBULATORY_CARE_PROVIDER_SITE_OTHER): Payer: Medicare Other

## 2022-10-10 VITALS — BP 132/83 | HR 58 | Temp 98.0°F | Resp 16 | Ht 72.0 in | Wt 227.5 lb

## 2022-10-10 DIAGNOSIS — E782 Mixed hyperlipidemia: Secondary | ICD-10-CM | POA: Diagnosis not present

## 2022-10-10 DIAGNOSIS — I70213 Atherosclerosis of native arteries of extremities with intermittent claudication, bilateral legs: Secondary | ICD-10-CM | POA: Diagnosis not present

## 2022-10-10 MED ORDER — INCLISIRAN SODIUM 284 MG/1.5ML ~~LOC~~ SOSY
284.0000 mg | PREFILLED_SYRINGE | Freq: Once | SUBCUTANEOUS | Status: AC
  Administered 2022-10-10: 284 mg via SUBCUTANEOUS
  Filled 2022-10-10: qty 1.5

## 2022-10-10 NOTE — Progress Notes (Signed)
Diagnosis: Hyperlipidemia  Provider:  Marshell Garfinkel MD  Procedure: Injection  Leqvio (inclisiran), Dose: 284 mg, Site: subcutaneous, Number of injections: 1  Post Care: Observation period completed  Discharge: Condition: Good, Destination: Home . AVS provided to patient.   Performed by:  Adelina Mings, LPN

## 2022-10-31 ENCOUNTER — Other Ambulatory Visit: Payer: Self-pay | Admitting: Cardiology

## 2022-11-20 ENCOUNTER — Other Ambulatory Visit: Payer: Self-pay | Admitting: Cardiology

## 2022-11-30 ENCOUNTER — Encounter: Payer: Self-pay | Admitting: Cardiology

## 2022-11-30 ENCOUNTER — Other Ambulatory Visit: Payer: Self-pay

## 2022-11-30 DIAGNOSIS — I219 Acute myocardial infarction, unspecified: Secondary | ICD-10-CM

## 2022-11-30 DIAGNOSIS — E782 Mixed hyperlipidemia: Secondary | ICD-10-CM

## 2022-11-30 DIAGNOSIS — I70213 Atherosclerosis of native arteries of extremities with intermittent claudication, bilateral legs: Secondary | ICD-10-CM

## 2022-11-30 LAB — LIPID PANEL
Chol/HDL Ratio: 3.9 ratio (ref 0.0–5.0)
Cholesterol, Total: 130 mg/dL (ref 100–199)
HDL: 33 mg/dL — ABNORMAL LOW (ref >39)
LDL Chol Calc (NIH): 69 mg/dL (ref 0–99)
Triglycerides: 165 mg/dL — ABNORMAL HIGH (ref 0–149)
VLDL Cholesterol Cal: 28 mg/dL (ref 5–40)

## 2022-12-02 ENCOUNTER — Telehealth: Payer: Self-pay

## 2022-12-02 NOTE — Telephone Encounter (Signed)
-----   Message from Park Liter, MD sent at 12/01/2022 10:27 AM EST ----- Fasting lipid profile looks good

## 2022-12-02 NOTE — Telephone Encounter (Signed)
Patient notified of results.

## 2022-12-21 ENCOUNTER — Encounter: Payer: Self-pay | Admitting: Cardiology

## 2022-12-21 ENCOUNTER — Other Ambulatory Visit (HOSPITAL_COMMUNITY): Payer: Self-pay

## 2023-01-04 ENCOUNTER — Other Ambulatory Visit: Payer: Self-pay | Admitting: Cardiology

## 2023-01-11 ENCOUNTER — Ambulatory Visit (INDEPENDENT_AMBULATORY_CARE_PROVIDER_SITE_OTHER): Payer: Medicare Other

## 2023-01-11 VITALS — BP 147/75 | HR 49 | Temp 97.7°F | Resp 18 | Ht 72.0 in | Wt 223.8 lb

## 2023-01-11 DIAGNOSIS — I70213 Atherosclerosis of native arteries of extremities with intermittent claudication, bilateral legs: Secondary | ICD-10-CM | POA: Diagnosis not present

## 2023-01-11 DIAGNOSIS — E782 Mixed hyperlipidemia: Secondary | ICD-10-CM

## 2023-01-11 MED ORDER — INCLISIRAN SODIUM 284 MG/1.5ML ~~LOC~~ SOSY
284.0000 mg | PREFILLED_SYRINGE | Freq: Once | SUBCUTANEOUS | Status: AC
  Administered 2023-01-11: 284 mg via SUBCUTANEOUS
  Filled 2023-01-11: qty 1.5

## 2023-01-11 NOTE — Progress Notes (Signed)
Diagnosis: Hyperlipidemia  Provider:  Marshell Garfinkel MD  Procedure: Injection  Leqvio (inclisiran), Dose: 284 mg, Site: subcutaneous, Number of injections: 1  Post Care:  n/a  Discharge: Condition: Good, Destination: Home . AVS Provided and AVS Declined  Performed by:  Cleophus Molt, RN

## 2023-01-11 NOTE — Patient Instructions (Signed)
Inclisiran Injection What is this medication? INCLISIRAN (in [place] an) treats high cholesterol. It works by decreasing bad cholesterol (such as LDL) in your blood. Changes to diet and exercise are often combined with this medication. This medicine may be used for other purposes; ask your health care provider or pharmacist if you have questions. COMMON BRAND NAME(S): LEQVIO What should I tell my care team before I take this medication? They need to know if you have any of these conditions: An unusual or allergic reaction to inclisiran, other medications, foods, dyes, or preservatives Pregnant or trying to get pregnant Breast-feeding How should I use this medication? This medication is injected under the skin. It is given by your care team in a hospital or clinic setting. Talk to your care team about the use of this medication in children. Special care may be needed. Overdosage: If you think you have taken too much of this medicine contact a poison control center or emergency room at once. NOTE: This medicine is only for you. Do not share this medicine with others. What if I miss a dose? Keep appointments for follow-up doses. It is important not to miss your dose. Call your care team if you are unable to keep an appointment. What may interact with this medication? Interactions are not expected. This list may not describe all possible interactions. Give your health care provider a list of all the medicines, herbs, non-prescription drugs, or dietary supplements you use. Also tell them if you smoke, drink alcohol, or use illegal drugs. Some items may interact with your medicine. What should I watch for while using this medication? Visit your care team for regular checks on your progress. Tell your care team if your symptoms do not start to get better or if they get worse. You may need blood work while you are taking this medication. What side effects may I notice from receiving this  medication? Side effects that you should report to your care team as soon as possible: Allergic reactions--skin rash, itching, hives, swelling of the face, lips, tongue, or throat Side effects that usually do not require medical attention (report these to your care team if they continue or are bothersome): Joint pain Pain, redness, or irritation at injection site This list may not describe all possible side effects. Call your doctor for medical advice about side effects. You may report side effects to FDA at 1-800-FDA-1088. Where should I keep my medication? This medication is given in a hospital or clinic. It will not be stored at home. NOTE: This sheet is a summary. It may not cover all possible information. If you have questions about this medicine, talk to your doctor, pharmacist, or health care provider.  2023 Elsevier/Gold Standard (2020-12-02 00:00:00)

## 2023-03-16 LAB — LAB REPORT - SCANNED
A1c: 6.4
EGFR: 70

## 2023-07-07 ENCOUNTER — Other Ambulatory Visit: Payer: Self-pay | Admitting: Cardiology

## 2023-07-13 ENCOUNTER — Ambulatory Visit (INDEPENDENT_AMBULATORY_CARE_PROVIDER_SITE_OTHER): Payer: Medicare Other

## 2023-07-13 VITALS — BP 143/79 | HR 59 | Temp 97.4°F | Resp 18 | Ht 72.0 in | Wt 210.4 lb

## 2023-07-13 DIAGNOSIS — I70213 Atherosclerosis of native arteries of extremities with intermittent claudication, bilateral legs: Secondary | ICD-10-CM

## 2023-07-13 DIAGNOSIS — E782 Mixed hyperlipidemia: Secondary | ICD-10-CM

## 2023-07-13 MED ORDER — INCLISIRAN SODIUM 284 MG/1.5ML ~~LOC~~ SOSY
284.0000 mg | PREFILLED_SYRINGE | Freq: Once | SUBCUTANEOUS | Status: AC
  Administered 2023-07-13: 284 mg via SUBCUTANEOUS
  Filled 2023-07-13: qty 1.5

## 2023-07-13 NOTE — Progress Notes (Signed)
Diagnosis: Hyperlipidemia  Provider:  Chilton Greathouse MD  Procedure: Injection  Leqvio (inclisiran), Dose: 284 mg, Site: subcutaneous, Number of injections: 1  Post Care: Patient declined observation  Discharge: Condition: Good, Destination: Home . AVS Provided  Performed by:  Wyvonne Lenz, RN

## 2023-07-30 ENCOUNTER — Other Ambulatory Visit: Payer: Self-pay | Admitting: Cardiology

## 2023-08-16 DIAGNOSIS — N400 Enlarged prostate without lower urinary tract symptoms: Secondary | ICD-10-CM | POA: Insufficient documentation

## 2023-08-18 ENCOUNTER — Ambulatory Visit: Payer: Medicare Other | Attending: Cardiology | Admitting: Cardiology

## 2023-08-18 ENCOUNTER — Encounter: Payer: Self-pay | Admitting: Cardiology

## 2023-08-18 VITALS — BP 147/81 | HR 54 | Ht 72.0 in | Wt 209.0 lb

## 2023-08-18 DIAGNOSIS — Z951 Presence of aortocoronary bypass graft: Secondary | ICD-10-CM | POA: Insufficient documentation

## 2023-08-18 DIAGNOSIS — E782 Mixed hyperlipidemia: Secondary | ICD-10-CM | POA: Diagnosis present

## 2023-08-18 DIAGNOSIS — I7121 Aneurysm of the ascending aorta, without rupture: Secondary | ICD-10-CM | POA: Insufficient documentation

## 2023-08-18 NOTE — Addendum Note (Signed)
Addended by: Eleonore Chiquito on: 08/18/2023 09:02 AM   Modules accepted: Orders

## 2023-08-18 NOTE — Progress Notes (Signed)
Cardiology Office Note:    Date:  08/18/2023   ID:  Ulla Gallo Netzer, DOB 1951/07/02, MRN [id number]  PCP:  Galvin Proffer, MD  Cardiologist:  Gypsy Balsam, MD    Referring MD: Galvin Proffer, MD   Chief Complaint  Patient presents with   Follow-up    History of Present Illness:    Brady Singh is a 72 y.o. male  with past medical history significant for coronary artery disease in 2009 he did have PTCA and drug-eluting stent to the right coronary artery and circumflex artery in face of acute inferior posterior wall myocardial infarction. In 2019 he did have another cardiac catheterization was find to have significant valvular three-vessel disease and coronary bypass graft has been performed with LIMA to LAD, SVG to diagonal 1, SVG to obtuse marginal branch and SVG to PDA. Ejection fraction at that time was 70%. He had episode of atrial fibrillation around surgical time however after there are no more recurrences of arrhythmia. Also history of malignant right kidney cancer, essential hypertension, dyslipidemia.  Comes today to months for follow-up.  Overall doing very well.  Goes to gym every single day in the morning 5:00 to [facility] and exercise however I question him again about avoidance of isovolumetric exercises and he does not do it mostly aerobic exercises did not notice any decrease ability to exercise, no chest pain tightness squeezing pressure burning chest  Past Medical History:  Diagnosis Date   Abdominal aortic aneurysm without rupture (HCC) 07/26/2021   Acute blood loss anemia 06/02/2017   Allergic contact dermatitis due to plants, except food 07/26/2021   Arthritis 07/22/2021   Formatting of this note might be different from the original. Generalized, hands the worse   Ascending aortic aneurysm (HCC) 08/29/2022   Atherosclerosis of native arteries of extremities with intermittent claudication, bilateral legs (HCC) 07/26/2021   Atherosclerosis of native arteries of  extremities with rest pain, unspecified extremity (HCC) 08/31/2015   Formatting of this note might be different from the original.  Overview:   Drug-eluting stent to RCA and circumflex in September 2009 in face of the inferior wall myocardial infarction  Formatting of this note might be different from the original.  Drug-eluting stent to RCA and circumflex in September 2009 in face of the inferior wall myocardial infarction     Atherosclerotic heart disease of native coronary artery without angina pectoris 08/31/2015   Formatting of this note might be different from the original. Overview:  Drug-eluting stent to RCA and circumflex in September 2009 in face of the inferior wall myocardial infarction Formatting of this note might be different from the original. Drug-eluting stent to RCA and circumflex in September 2009 in face of the inferior wall myocardial infarction   Atrial fibrillation (HCC) 06/07/2017   Benign prostatic hyperplasia with lower urinary tract symptoms 09/24/2014   Benign prostatic hyperplasia with urinary obstruction 09/24/2014   Bilateral carotid artery stenosis 03/28/2017   Bladder cancer (HCC)    Blister of right foot 05/27/2020   Formatting of this note might be different from the original. Healing/resolving   BPH (benign prostatic hyperplasia)    Cancer (HCC) 11/28/2014   Formatting of this note might be different from the original. Renal cell CA   Coronary artery stenosis 10/13/2014   Formatting of this note might be different from the original. 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  two stents   History of bladder cancer 07/22/2021   History of left-sided carotid endarterectomy 11/23/2017   Hyperglycemia due to type 2 diabetes mellitus (HCC) 07/26/2021   Hyperlipemia 10/13/2014   Hypothyroidism 07/26/2021   Insomnia 08/25/2022   Left inguinal hernia 10/13/2014   Long-term use of aspirin therapy 11/23/2017   Low back pain 10/25/2021   Malignant neoplasm of right kidney (HCC) 09/24/2014   Mixed hyperlipidemia 10/13/2014   Myalgia due to statin 09/06/2022   Myocardial infarction [facility]) 2009   Nocturia 07/22/2021   Formatting of this note might be different from the original. 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 11/23/2017   Peyronie's disease 10/13/2014   Plantar fasciitis of right foot 05/27/2020   Pleural effusion 02/05/2018   Polyneuropathy 07/26/2021   Postoperative atrial fibrillation (HCC) 06/07/2017   Postoperative voiding difficulty 07/22/2021   Pre-op evaluation 04/12/2017   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   Pure hypercholesterolemia 08/31/2015   Renal cell carcinoma, right (HCC) 12/29/2016   Renal mass, right 02/13/2015   Right renal mass    Spermatocele 10/13/2014   Status post coronary artery bypass graft LIMA to the LAD SVG to D1 SVG to OM1 SVG to PDA EF 70% 06/01/2017  06/02/2017   Umbilical hernia without obstruction or gangrene 03/28/2017   Unstable angina (HCC) 05/29/2017   Vitamin D deficiency 07/26/2021    Past Surgical History:  Procedure Laterality Date   CORONARY ANGIOPLASTY     Coronary  angio w/ stent placement x 2    HERNIA REPAIR  2013   Left Inguinal Hernia repair   IR GENERIC HISTORICAL  12/09/2014   IR RADIOLOGIST EVAL & MGMT 12/09/2014 Berdine Dance, MD [facility] INTERV RAD   IR RADIOLOGIST EVAL & MGMT  01/25/2018   TONSILLECTOMY     TRANSURETHRAL RESECTION OF BLADDER TUMOR  2007; 2010     VASECTOMY      Current Medications: Current Meds  Medication Sig   amLODipine (NORVASC) 10 MG tablet Take 1 tablet (10 mg total) by mouth daily.   aspirin 325 MG EC tablet Take 1 tablet by mouth daily.   carvedilol (COREG) 12.5 MG tablet TAKE 1 TABLET (12.5MG  TOTAL) BY MOUTH TWICE A DAY WITH MEALS   gabapentin (NEURONTIN) 300 MG capsule Take 300 mg by mouth 2 (two) times daily.   hydrochlorothiazide (HYDRODIURIL) 12.5 MG tablet Take 25 mg by mouth every morning.   hydrochlorothiazide (MICROZIDE) 12.5 MG capsule TAKE 2 CAPSULES (25 MG TOTAL) BY MOUTH 2 (TWO) TIMES DAILY.   inclisiran (LEQVIO) 284 MG/1.5ML SOSY injection Inject 284 mg into the skin every 6 (six) months.   lisinopril (PRINIVIL,ZESTRIL) 40 MG tablet Take 40 mg by mouth daily.   omeprazole (PRILOSEC) 40 MG capsule Take 40 mg by mouth daily.   tamsulosin (FLOMAX) 0.4 MG CAPS  capsule Take 0.4 mg by mouth daily.     Allergies:   Atorvastatin   Social History   Socioeconomic History   Marital status: Married    Spouse name: Not on file   Number of children: Not on file   Years of education: Not on file   Highest education level: Not on file  Occupational History   Not on file  Tobacco Use   Smoking status: Never   Smokeless tobacco: Never  Substance and Sexual Activity   Alcohol use: No    Alcohol/week: 0.0 standard drinks of alcohol   Drug use: No   Sexual activity: Yes  Other Topics Concern   Not on file  Social History Narrative   Not on file   Social Determinants of Health   Financial Resource Strain: Not on file  Food Insecurity: Not on file  Transportation Needs: Not on file  Physical Activity:  Not on file  Stress: Not on file  Social Connections: Not on file     Family History: The patient's family history is not on file. ROS:   Please see the history of present illness.    All 14 point review of systems negative except as described per history of present illness  EKGs/Labs/Other Studies Reviewed:         Recent Labs: No results found for requested labs within last 365 days.  Recent Lipid Panel    Component Value Date/Time   CHOL 130 11/30/2022 0914   TRIG 165 (H) 11/30/2022 0914   HDL 33 (L) 11/30/2022 0914   CHOLHDL 3.9 11/30/2022 0914   LDLCALC 69 11/30/2022 0914    Physical Exam:    VS:  BP (!) 147/81 (BP Location: Right Arm, Patient Position: Sitting, Cuff Size: Normal)   Pulse (!) 54   Ht 6' (1.829 m)   Wt 209 lb (94.8 kg)   SpO2 96%   BMI 28.35 kg/m     Wt Readings from Last 3 Encounters:  08/18/23 209 lb (94.8 kg)  07/13/23 210 lb 6.4 oz (95.4 kg)  01/11/23 223 lb 12.8 oz (101.5 kg)     GEN:  Well nourished, well developed in no acute distress HEENT: Normal NECK: No JVD; No carotid bruits LYMPHATICS: No lymphadenopathy CARDIAC: RRR, no murmurs, no rubs, no gallops RESPIRATORY:  Clear to auscultation without rales, wheezing or rhonchi  ABDOMEN: Soft, non-tender, non-distended MUSCULOSKELETAL:  No edema; No deformity  SKIN: Warm and dry LOWER EXTREMITIES: no swelling NEUROLOGIC:  Alert and oriented x 3 PSYCHIATRIC:  Normal affect   ASSESSMENT:    1. Aneurysm of ascending aorta without rupture (HCC)   2. Status post coronary artery bypass graft LIMA to the LAD SVG to D1 SVG to OM1 SVG to PDA EF 70% 06/01/2017    3. Mixed hyperlipidemia    PLAN:    In order of problems listed above:  Coronary disease stable from that point review continue monitoring. Ascending aortic aneurysm I will ask him to have chest CT angio to follow-up on his aneurysm.  Last diameter was 46 mm. Mixed dyslipidemia he is being followed by our pharmacist.  He is on  Leqvio last cholesterol I see is from general 2024 which show LDL 69 HDL 33 good control continue present management. I congratulated him for exercises on the regular basis encouraged him to continue.  I cautioned him against asymptomatic exercises   Medication Adjustments/Labs and Tests Ordered: Current medicines are reviewed at length with the patient today.  Concerns  regarding medicines are outlined above.  No orders of the defined types were placed in this encounter.  Medication changes: No orders of the defined types were placed in this encounter.   Signed, Georgeanna Lea, MD, [facility] 08/18/2023 8:48 AM    [facility]

## 2023-08-18 NOTE — Patient Instructions (Signed)

## 2023-08-19 LAB — BASIC METABOLIC PANEL
BUN/Creatinine Ratio: 18 (ref 10–24)
BUN: 15 mg/dL (ref 8–27)
CO2: 27 mmol/L (ref 20–29)
Calcium: 9.1 mg/dL (ref 8.6–10.2)
Chloride: 102 mmol/L (ref 96–106)
Creatinine, Ser: 0.84 mg/dL (ref 0.76–1.27)
Glucose: 106 mg/dL — ABNORMAL HIGH (ref 70–99)
Potassium: 3.5 mmol/L (ref 3.5–5.2)
Sodium: 144 mmol/L (ref 134–144)
eGFR: 92 mL/min/{1.73_m2} (ref >59)

## 2023-08-23 ENCOUNTER — Other Ambulatory Visit: Payer: Self-pay | Admitting: Cardiology

## 2023-08-28 ENCOUNTER — Telehealth: Payer: Self-pay

## 2023-08-28 NOTE — Telephone Encounter (Signed)
Patient notified of results and verbalized understanding.  

## 2023-08-28 NOTE — Telephone Encounter (Signed)
-----   Message from Gypsy Balsam sent at 08/23/2023 11:02 AM EDT ----- Chem-7 looks good

## 2023-09-08 ENCOUNTER — Encounter: Payer: Self-pay | Admitting: Cardiology

## 2023-10-02 ENCOUNTER — Telehealth: Payer: Self-pay

## 2023-10-02 NOTE — Telephone Encounter (Signed)
CT Results reviewed with Spouse per DPR as per Dr. Vanetta Shawl note. Spouse verbalized understanding and had no additional questions. Routed to PCP

## 2023-10-04 ENCOUNTER — Other Ambulatory Visit: Payer: Self-pay | Admitting: Cardiology

## 2023-12-07 ENCOUNTER — Telehealth: Payer: Self-pay

## 2023-12-07 NOTE — Telephone Encounter (Signed)
 Auth Submission: NO AUTH NEEDED Payer: MEDICARE A/B & BCBS SUPP Medication & CPT/J Code(s) submitted: Leqvio  (Inclisiran) J1306 Route of submission (phone, fax, portal):  Phone # Fax # Auth type: Buy/Bill Units/visits requested: 284mg  x 2 doses Reference number:  Approval from: 09/30/22 to 12/28/24

## 2024-01-15 ENCOUNTER — Ambulatory Visit: Payer: Medicare Other

## 2024-01-15 VITALS — BP 169/84 | HR 52 | Temp 98.4°F | Resp 18 | Ht 72.0 in | Wt 209.8 lb

## 2024-01-15 DIAGNOSIS — I70213 Atherosclerosis of native arteries of extremities with intermittent claudication, bilateral legs: Secondary | ICD-10-CM

## 2024-01-15 DIAGNOSIS — E782 Mixed hyperlipidemia: Secondary | ICD-10-CM | POA: Diagnosis not present

## 2024-01-15 MED ORDER — INCLISIRAN SODIUM 284 MG/1.5ML ~~LOC~~ SOSY
284.0000 mg | PREFILLED_SYRINGE | Freq: Once | SUBCUTANEOUS | Status: AC
Start: 2024-01-15 — End: 2024-01-15
  Administered 2024-01-15: 284 mg via SUBCUTANEOUS
  Filled 2024-01-15: qty 1.5

## 2024-01-15 NOTE — Progress Notes (Signed)
 Diagnosis: Hyperlipidemia  Provider:  Chilton Greathouse MD  Procedure: Injection  Leqvio (inclisiran), Dose: 284 mg, Site: subcutaneous, Number of injections: 1  Injection Site(s): Left upper quad. abdomen  Post Care: Injection  Discharge: Condition: Good, Destination: Home . AVS Provided  Performed by:  Nat Math, RN

## 2024-03-08 ENCOUNTER — Encounter: Payer: Self-pay | Admitting: Cardiology

## 2024-03-08 ENCOUNTER — Ambulatory Visit: Payer: Medicare Other | Attending: Cardiology | Admitting: Cardiology

## 2024-03-08 VITALS — BP 130/74 | HR 59 | Ht 72.0 in | Wt 207.6 lb

## 2024-03-08 DIAGNOSIS — C641 Malignant neoplasm of right kidney, except renal pelvis: Secondary | ICD-10-CM | POA: Insufficient documentation

## 2024-03-08 DIAGNOSIS — I480 Paroxysmal atrial fibrillation: Secondary | ICD-10-CM | POA: Insufficient documentation

## 2024-03-08 DIAGNOSIS — I2510 Atherosclerotic heart disease of native coronary artery without angina pectoris: Secondary | ICD-10-CM | POA: Insufficient documentation

## 2024-03-08 DIAGNOSIS — E785 Hyperlipidemia, unspecified: Secondary | ICD-10-CM | POA: Insufficient documentation

## 2024-03-08 NOTE — Addendum Note (Signed)
 Addended by: Baldo Ash D on: 03/08/2024 10:06 AM   Modules accepted: Orders

## 2024-03-08 NOTE — Progress Notes (Signed)
 Cardiology Office Note:    Date:  03/08/2024   ID:  Brady Singh, DOB March 21, 1951, MRN [id number]  PCP:  Shelbie Ammons, MD  Cardiologist:  Gypsy Balsam, MD    Referring MD: Shelbie Ammons, MD   Chief Complaint  Patient presents with   Follow-up    History of Present Illness:    Brady Singh is a 73 y.o. male  with past medical history significant for coronary artery disease in 2009 he did have PTCA and drug-eluting stent to the right coronary artery and circumflex artery in face of acute inferior posterior wall myocardial infarction. In 2019 he did have another cardiac catheterization was find to have significant valvular three-vessel disease and coronary bypass graft has been performed with LIMA to LAD, SVG to diagonal 1, SVG to obtuse marginal branch and SVG to PDA. Ejection fraction at that time was 70%. He had episode of atrial fibrillation around surgical time however after there are no more recurrences of arrhythmia. Also history of malignant right kidney cancer, essential hypertension, dyslipidemia.  Comes today to months for follow-up.  Overall doing very well.  Goes to gym every single day in the morning 5:00 to [facility] Comes to the office for follow-up.  Overall he is doing well he goes to gym on the regular basis exercise every day at 5:00 in the morning.  He also tried to do some college at work however he said when he does garden work he gets tired much more easily he said he can last maybe only 4 hours which is still not bad.  Denies have any chest pain the problem is stress shortness of breath and fatigue.  Past Medical History:  Diagnosis Date   Abdominal aortic aneurysm without rupture (HCC) 07/26/2021   Acute blood loss anemia 06/02/2017   Allergic contact dermatitis due to plants, except food 07/26/2021   Arthritis 07/22/2021   Formatting of this note might be different from the original. Generalized, hands the worse   Ascending aortic aneurysm (HCC)  08/29/2022   Atherosclerosis of native arteries of extremities with intermittent claudication, bilateral legs (HCC) 07/26/2021   Atherosclerosis of native arteries of extremities with rest pain, unspecified extremity (HCC) 08/31/2015   Formatting of this note might be different from the original.  Overview:   Drug-eluting stent to RCA and circumflex in September 2009 in face of the inferior wall myocardial infarction  Formatting of this note might be different from the original.  Drug-eluting stent to RCA and circumflex in September 2009 in face of the inferior wall myocardial infarction     Atherosclerotic heart disease of native coronary artery without angina pectoris 08/31/2015   Formatting of this note might be different from the original. Overview:  Drug-eluting stent to RCA and circumflex in September 2009 in face of the inferior wall myocardial infarction Formatting of this note might be different from the original. Drug-eluting stent to RCA and circumflex in September 2009 in face of the inferior wall myocardial infarction   Atrial fibrillation (HCC) 06/07/2017   Benign prostatic hyperplasia with lower urinary tract symptoms 09/24/2014   Benign prostatic hyperplasia with urinary obstruction 09/24/2014   Bilateral carotid artery stenosis 03/28/2017   Bladder cancer (HCC)    Blister of right foot 05/27/2020   Formatting of this note might be different from the original. Healing/resolving   BPH (benign prostatic hyperplasia)    Cancer (HCC) 11/28/2014   Formatting of this note might be different from the original. Renal cell  CA   Coronary artery stenosis 10/13/2014   Formatting of this note might be different from the original. 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 two stents   History of bladder cancer 07/22/2021   History of left-sided carotid endarterectomy 11/23/2017   Hyperglycemia due to type 2 diabetes mellitus (HCC) 07/26/2021   Hyperlipemia 10/13/2014   Hypothyroidism 07/26/2021   Insomnia 08/25/2022   Left inguinal hernia 10/13/2014   Long-term use of aspirin therapy 11/23/2017   Low back pain 10/25/2021   Malignant neoplasm of right kidney (HCC) 09/24/2014   Mixed hyperlipidemia 10/13/2014   Myalgia due to statin 09/06/2022   Myocardial infarction [facility]) 2009   Nocturia 07/22/2021   Formatting of this note might be different from the original. 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 11/23/2017   Peyronie's disease 10/13/2014   Plantar fasciitis of right foot 05/27/2020   Pleural effusion 02/05/2018   Polyneuropathy 07/26/2021   Postoperative atrial fibrillation (HCC) 06/07/2017   Postoperative voiding difficulty 07/22/2021   Pre-op evaluation 04/12/2017   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   Pure hypercholesterolemia 08/31/2015   Renal cell carcinoma, right (HCC) 12/29/2016   Renal mass, right 02/13/2015   Right renal mass    Spermatocele 10/13/2014   Status post coronary artery bypass graft LIMA to the LAD SVG to D1 SVG to OM1 SVG to PDA EF 70% 06/01/2017  06/02/2017   Umbilical hernia without obstruction or gangrene  03/28/2017   Unstable angina (HCC) 05/29/2017   Vitamin D deficiency 07/26/2021    Past Surgical History:  Procedure Laterality Date   CORONARY ANGIOPLASTY     Coronary angio w/ stent placement x 2    HERNIA REPAIR  2013   Left Inguinal Hernia repair   IR GENERIC HISTORICAL  12/09/2014   IR RADIOLOGIST EVAL & MGMT 12/09/2014 Berdine Dance, MD [facility] INTERV RAD   IR RADIOLOGIST EVAL & MGMT  01/25/2018   TONSILLECTOMY     TRANSURETHRAL RESECTION OF BLADDER TUMOR  2007; 2010     VASECTOMY      Current Medications: Current Meds  Medication Sig   amLODipine (NORVASC) 10 MG tablet TAKE 1 TABLET BY MOUTH EVERY DAY   aspirin 325 MG EC tablet Take 1 tablet by mouth daily.   carvedilol (COREG) 12.5 MG tablet TAKE 1 TABLET (12.5MG  TOTAL) BY MOUTH TWICE A DAY WITH MEALS (Patient taking differently: Take 12.5 mg by mouth 2 (two) times daily with a meal.)   gabapentin (NEURONTIN) 300 MG capsule Take 300 mg by mouth 2 (two) times daily.   hydrochlorothiazide (HYDRODIURIL) 12.5 MG tablet Take 25 mg by mouth every morning.   hydrochlorothiazide (MICROZIDE) 12.5 MG capsule TAKE 2 CAPSULES (25 MG TOTAL)  BY MOUTH 2 (TWO) TIMES DAILY.   inclisiran (LEQVIO) 284 MG/1.5ML SOSY injection Inject 284 mg into the skin every 6 (six) months.   lisinopril (PRINIVIL,ZESTRIL) 40 MG tablet Take 40 mg by mouth daily.   omeprazole (PRILOSEC) 40 MG capsule Take 40 mg by mouth daily.   tamsulosin (FLOMAX) 0.4 MG CAPS capsule Take 0.4 mg by mouth daily.     Allergies:   Atorvastatin   Social History   Socioeconomic History   Marital status: Married    Spouse name: Not on file   Number of children: Not on file   Years of education: Not on file   Highest education level: Not on file  Occupational History   Not on file  Tobacco Use   Smoking status: Never   Smokeless tobacco: Never  Substance and Sexual Activity   Alcohol use: No    Alcohol/week: 0.0 standard drinks of alcohol   Drug use: No   Sexual  activity: Yes  Other Topics Concern   Not on file  Social History Narrative   Not on file   Social Drivers of Health   Financial Resource Strain: Not on file  Food Insecurity: Not on file  Transportation Needs: Not on file  Physical Activity: Not on file  Stress: Not on file  Social Connections: Not on file     Family History: The patient's family history is not on file. ROS:   Please see the history of present illness.    All 14 point review of systems negative except as described per history of present illness  EKGs/Labs/Other Studies Reviewed:    EKG Interpretation Date/Time:  Friday March 08 2024 09:14:17 EDT Ventricular Rate:  59 PR Interval:  240 QRS Duration:  94 QT Interval:  412 QTC Calculation: 407 R Axis:   39  Text Interpretation: Sinus bradycardia with 1st degree A-V block Otherwise normal ECG No previous ECGs available Confirmed by Gypsy Balsam [phone]) on 03/08/2024 9:42:10 AM    Recent Labs: 08/18/2023: BUN 15; Creatinine, Ser 0.84; Potassium 3.5; Sodium 144  Recent Lipid Panel    Component Value Date/Time   CHOL 130 11/30/2022 0914   TRIG 165 (H) 11/30/2022 0914   HDL 33 (L) 11/30/2022 0914   CHOLHDL 3.9 11/30/2022 0914   LDLCALC 69 11/30/2022 0914    Physical Exam:    VS:  BP 130/74 (BP Location: Right Arm, Patient Position: Sitting)   Pulse (!) 59   Ht 6' (1.829 m)   Wt 207 lb 9.6 oz (94.2 kg)   SpO2 98%   BMI 28.16 kg/m     Wt Readings from Last 3 Encounters:  03/08/24 207 lb 9.6 oz (94.2 kg)  01/15/24 209 lb 12.8 oz (95.2 kg)  08/18/23 209 lb (94.8 kg)     GEN:  Well nourished, well developed in no acute distress HEENT: Normal NECK: No JVD; No carotid bruits LYMPHATICS: No lymphadenopathy CARDIAC: RRR, no murmurs, no rubs, no gallops RESPIRATORY:  Clear to auscultation without rales, wheezing or rhonchi  ABDOMEN: Soft, non-tender, non-distended MUSCULOSKELETAL:  No edema; No deformity  SKIN: Warm and dry LOWER  EXTREMITIES: no swelling NEUROLOGIC:  Alert and oriented x 3 PSYCHIATRIC:  Normal affect   ASSESSMENT:    1. Paroxysmal atrial fibrillation (HCC)   2. Coronary artery stenosis   3. Malignant neoplasm of right kidney (HCC)    PLAN:    In order of problems listed above:  Paroxysmal atrial fibrillation there was episode only after bypass surgery.  Will continue monitoring. Coronary artery disease status post chronic bypass graft stable denies have any chest pain tightness squeezing pressure burning chest. Dyspnea on exertion will schedule him to have echocardiogram to assess left ventricle ejection fraction. Thoracic aortic aneurysm.  Last diameter 46 mm stable we will schedule him to have CT of his chest at the end of the year we will also look at his abdominal aorta at that time. History of right kidney cancer.  Stable Dyslipidemia did review K PN I have dated from January 2024 LDL 69 HDL 33 will recheck fasting lipid profile today Interesting story he does have neuropathy and he did discover that magnesium oil seems to be helping he put this on his legs and that seems to be helping tremendously he is worried about magnesium which I will check level today    Medication Adjustments/Labs and Tests Ordered: Current medicines are reviewed at length with the patient today.  Concerns regarding medicines are outlined above.  Orders Placed This Encounter  Procedures   EKG 12-Lead   Medication changes: No orders of the defined types were placed in this encounter.   Signed, Georgeanna Lea, MD, [facility] 03/08/2024 9:52 AM    [facility]

## 2024-03-08 NOTE — Patient Instructions (Signed)
 Medication Instructions:  Your physician recommends that you continue on your current medications as directed. Please refer to the Current Medication list given to you today.  *If you need a refill on your cardiac medications before your next appointment, please call your pharmacy*   Lab Work: Mgm, Lipid, AST, ALT- today If you have labs (blood work) drawn today and your tests are completely normal, you will receive your results only by: MyChart Message (if you have MyChart) OR A paper copy in the mail If you have any lab test that is abnormal or we need to change your treatment, we will call you to review the results.   Testing/Procedures: None Ordered   Follow-Up: At [facility], you and your health needs are our priority.  As part of our continuing mission to provide you with exceptional heart care, we have created designated Provider Care Teams.  These Care Teams include your primary Cardiologist (physician) and Advanced Practice Providers (APPs -  Physician Assistants and Nurse Practitioners) who all work together to provide you with the care you need, when you need it.  We recommend signing up for the patient portal called "MyChart".  Sign up information is provided on this After Visit Summary.  MyChart is used to connect with patients for Virtual Visits (Telemedicine).  Patients are able to view lab/test results, encounter notes, upcoming appointments, etc.  Non-urgent messages can be sent to your provider as well.   To learn more about what you can do with MyChart, go to [email or web address].    Your next appointment:   6 month(s)  The format for your next appointment:   In Person  Provider:   Gypsy Balsam, MD    Other Instructions NA

## 2024-03-09 LAB — ALT: ALT: 28 IU/L (ref 0–44)

## 2024-03-09 LAB — LIPID PANEL
Chol/HDL Ratio: 3.6 ratio (ref 0.0–5.0)
Cholesterol, Total: 115 mg/dL (ref 100–199)
HDL: 32 mg/dL — ABNORMAL LOW (ref >39)
LDL Chol Calc (NIH): 59 mg/dL (ref 0–99)
Triglycerides: 136 mg/dL (ref 0–149)
VLDL Cholesterol Cal: 24 mg/dL (ref 5–40)

## 2024-03-09 LAB — MAGNESIUM: Magnesium: 2.4 mg/dL — ABNORMAL HIGH (ref 1.6–2.3)

## 2024-03-09 LAB — AST: AST: 24 IU/L (ref 0–40)

## 2024-03-12 ENCOUNTER — Telehealth: Payer: Self-pay

## 2024-03-12 NOTE — Telephone Encounter (Signed)
 Spoke with Marcelino Sera (wife on [place]). Notified of results and recommendation.

## 2024-03-12 NOTE — Telephone Encounter (Signed)
-----   Message from Ralene Burger sent at 03/12/2024  8:27 AM EDT ----- Cholesterol looks normal magnesium also within normal limits continue present management

## 2024-04-10 ENCOUNTER — Encounter (HOSPITAL_BASED_OUTPATIENT_CLINIC_OR_DEPARTMENT_OTHER): Payer: Self-pay

## 2024-04-10 ENCOUNTER — Ambulatory Visit (HOSPITAL_BASED_OUTPATIENT_CLINIC_OR_DEPARTMENT_OTHER)
Admission: EM | Admit: 2024-04-10 | Discharge: 2024-04-10 | Disposition: A | Discharge status: Home / Self Care | Attending: Family Medicine | Admitting: Family Medicine

## 2024-04-10 DIAGNOSIS — M26622 Arthralgia of left temporomandibular joint: Secondary | ICD-10-CM

## 2024-04-10 MED ORDER — TRIAMCINOLONE ACETONIDE 40 MG/ML IJ SUSP
40.0000 mg | Freq: Once | INTRAMUSCULAR | Status: AC
Start: 2024-04-10 — End: 2024-04-10
  Administered 2024-04-10: 40 mg via INTRAMUSCULAR

## 2024-04-10 NOTE — ED Provider Notes (Signed)
 [facility]    CSN: [id number] Arrival date & time: 04/10/24  8657      History   Chief Complaint Chief Complaint  Patient presents with   Jaw Pain    HPI Brady Singh is a 73 y.o. male.   73 year old male presents today with left jaw pain.  This has been present for the past 2 days.  The pain is worse when opening his mouth and chewing.  Pain with palpation of the area.  Denies any injuries to the area.  Denies any fevers or chills.  Denies any dental issues.  Feels better when sitting up and not laying down.  Took Tylenol and ibuprofen with some relief     Past Medical History:  Diagnosis Date   Abdominal aortic aneurysm without rupture (HCC) 07/26/2021   Acute blood loss anemia 06/02/2017   Allergic contact dermatitis due to plants, except food 07/26/2021   Arthritis 07/22/2021   Formatting of this note might be different from the original. Generalized, hands the worse   Ascending aortic aneurysm (HCC) 08/29/2022   Atherosclerosis of native arteries of extremities with intermittent claudication, bilateral legs (HCC) 07/26/2021   Atherosclerosis of native arteries of extremities with rest pain, unspecified extremity (HCC) 08/31/2015   Formatting of this note might be different from the original.  Overview:   Drug-eluting stent to RCA and circumflex in September 2009 in face of the inferior wall myocardial infarction  Formatting of this note might be different from the original.  Drug-eluting stent to RCA and circumflex in September 2009 in face of the inferior wall myocardial infarction     Atherosclerotic heart disease of native coronary artery without angina pectoris 08/31/2015   Formatting of this note might be different from the original. Overview:  Drug-eluting stent to RCA and circumflex in September 2009 in face of the inferior wall myocardial infarction Formatting of this note might be different from the original. Drug-eluting stent to RCA and circumflex  in September 2009 in face of the inferior wall myocardial infarction   Atrial fibrillation (HCC) 06/07/2017   Benign prostatic hyperplasia with lower urinary tract symptoms 09/24/2014   Benign prostatic hyperplasia with urinary obstruction 09/24/2014   Bilateral carotid artery stenosis 03/28/2017   Bladder cancer (HCC)    Blister of right foot 05/27/2020   Formatting of this note might be different from the original. Healing/resolving   BPH (benign prostatic hyperplasia)    Cancer (HCC) 11/28/2014   Formatting of this note might be different from the original. Renal cell CA   Coronary artery stenosis 10/13/2014   Formatting of this note might be different from the original. 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 two stents   History of bladder cancer 07/22/2021   History of left-sided carotid endarterectomy 11/23/2017   Hyperglycemia due to type 2 diabetes mellitus (HCC) 07/26/2021   Hyperlipemia 10/13/2014   Hypothyroidism 07/26/2021   Insomnia 08/25/2022   Left inguinal hernia 10/13/2014   Long-term use of aspirin therapy 11/23/2017   Low back pain 10/25/2021   Malignant neoplasm of right kidney (HCC) 09/24/2014   Mixed hyperlipidemia  10/13/2014   Myalgia due to statin 09/06/2022   Myocardial infarction [facility]) 2009   Nocturia 07/22/2021   Formatting of this note might be different from the original. 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 11/23/2017   Peyronie's disease 10/13/2014   Plantar fasciitis of right foot 05/27/2020   Pleural effusion 02/05/2018   Polyneuropathy 07/26/2021   Postoperative atrial fibrillation (HCC) 06/07/2017   Postoperative voiding difficulty 07/22/2021   Pre-op evaluation 04/12/2017   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   Pure hypercholesterolemia 08/31/2015   Renal cell carcinoma, right (HCC) 12/29/2016   Renal mass, right 02/13/2015   Right renal mass    Spermatocele 10/13/2014   Status post coronary artery bypass graft LIMA to the LAD SVG to D1 SVG to OM1 SVG to PDA EF 70% 06/01/2017  06/02/2017   Umbilical hernia without obstruction or gangrene 03/28/2017   Unstable angina (HCC) 05/29/2017   Vitamin D deficiency 07/26/2021    Patient Active Problem List   Diagnosis Date Noted   BPH (benign prostatic hyperplasia)    Myalgia due to statin 09/06/2022   Ascending aortic aneurysm (HCC) 08/29/2022   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   Insomnia 08/25/2022   Bladder cancer (HCC) 11/01/2021   Low back pain 10/25/2021   Abdominal aortic aneurysm without rupture (HCC) 07/26/2021   Allergic contact dermatitis due to plants, except food 07/26/2021   Atherosclerosis of native arteries of extremities with intermittent claudication, bilateral legs (HCC) 07/26/2021   Hyperglycemia due to type 2 diabetes mellitus (HCC) 07/26/2021   Gastro-esophageal reflux disease without esophagitis 07/26/2021   Occlusion and stenosis of bilateral carotid arteries 07/26/2021   Non-toxic multinodular goiter 07/26/2021   Hypothyroidism 07/26/2021   Vitamin D deficiency 07/26/2021   Polyneuropathy 07/26/2021   Other  vitamin B12 deficiency anemias 07/26/2021   Other long term (current) drug therapy 07/26/2021   Arthritis 07/22/2021   History of bladder cancer 07/22/2021   PAD (peripheral artery disease) (HCC) 07/22/2021   Nocturia 07/22/2021   Postoperative voiding difficulty 07/22/2021   Blister of right foot 05/27/2020   Paresthesia of both feet 05/27/2020   Plantar fasciitis of right foot 05/27/2020   Pleural effusion 02/05/2018   Personal history of (corrected) congenital malformations of heart and circulatory system 11/23/2017   Long-term use of aspirin therapy 11/23/2017   History of left-sided carotid endarterectomy 11/23/2017   Atrial fibrillation (HCC) 06/07/2017   Postoperative atrial fibrillation (HCC) 06/07/2017   Acute blood loss anemia 06/02/2017   Status post coronary artery bypass graft LIMA to the LAD SVG to D1 SVG to OM1 SVG to PDA EF 70% 06/01/2017  06/02/2017   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   Unstable angina (HCC) 05/29/2017   Pre-op evaluation 04/12/2017   Bilateral carotid artery stenosis 03/28/2017   Umbilical hernia without obstruction or gangrene 03/28/2017   Renal cell carcinoma, right (HCC) 12/29/2016   Atherosclerosis of native arteries of extremities with rest pain, unspecified extremity (HCC) 08/31/2015   Pure hypercholesterolemia 08/31/2015   Atherosclerotic heart disease of native coronary artery without angina pectoris 08/31/2015  Right renal mass    Renal mass, right 02/13/2015   Other specified disorders of kidney and ureter 12/03/2014   Cancer (HCC) 11/28/2014   Epididymitis, right 10/13/2014   Disorder of male genital organs 10/13/2014   Coronary artery stenosis 10/13/2014   Frequency of micturition 10/13/2014   Essential hypertension 10/13/2014   Left inguinal hernia 10/13/2014   Mixed hyperlipidemia 10/13/2014   Peyronie's disease 10/13/2014   Spermatocele 10/13/2014   Hyperlipemia 10/13/2014   Benign prostatic hyperplasia with  lower urinary tract symptoms 09/24/2014   Malignant neoplasm of right kidney (HCC) 09/24/2014   Benign prostatic hyperplasia with urinary obstruction 09/24/2014   Heart attack (HCC) 11/29/2007   Myocardial infarction [facility]) 2009    Past Surgical History:  Procedure Laterality Date   CORONARY ANGIOPLASTY     Coronary angio w/ stent placement x 2    HERNIA REPAIR  2013   Left Inguinal Hernia repair   IR GENERIC HISTORICAL  12/09/2014   IR RADIOLOGIST EVAL & MGMT 12/09/2014 Lucinda Saber, MD [facility] INTERV RAD   IR RADIOLOGIST EVAL & MGMT  01/25/2018   TONSILLECTOMY     TRANSURETHRAL RESECTION OF BLADDER TUMOR  2007; 2010     VASECTOMY         Home Medications    Prior to Admission medications   Medication Sig Start Date End Date Taking? Authorizing Provider  amLODipine  (NORVASC ) 10 MG tablet TAKE 1 TABLET BY MOUTH EVERY DAY 08/23/23   Krasowski, Robert J, MD  aspirin 325 MG EC tablet Take 1 tablet by mouth daily. 09/18/20   [provider]  carvedilol  (COREG ) 12.5 MG tablet TAKE 1 TABLET (12.5MG  TOTAL) BY MOUTH TWICE A DAY WITH MEALS Patient taking differently: Take 12.5 mg by mouth 2 (two) times daily with a meal. 08/01/23   Manfred Seed, MD  gabapentin (NEURONTIN) 300 MG capsule Take 300 mg by mouth 2 (two) times daily.    [provider]  hydrochlorothiazide  (HYDRODIURIL ) 12.5 MG tablet Take 25 mg by mouth every morning.    [provider]  hydrochlorothiazide  (MICROZIDE ) 12.5 MG capsule TAKE 2 CAPSULES (25 MG TOTAL) BY MOUTH 2 (TWO) TIMES DAILY. 07/07/23   Manfred Seed, MD  inclisiran (LEQVIO ) 284 MG/1.5ML SOSY injection Inject 284 mg into the skin every 6 (six) months.    [provider]  lisinopril (PRINIVIL,ZESTRIL) 40 MG tablet Take 40 mg by mouth daily.    Scarlet Curly, MD  omeprazole (PRILOSEC) 40 MG capsule Take 40 mg by mouth daily. 07/14/21   [provider]  tamsulosin (FLOMAX) 0.4 MG CAPS capsule Take 0.4 mg by  mouth daily. 10/19/21   [provider]    Family History History reviewed. No pertinent family history.  Social History Social History   Tobacco Use   Smoking status: Never   Smokeless tobacco: Never  Substance Use Topics   Alcohol use: No    Alcohol/week: 0.0 standard drinks of alcohol   Drug use: No     Allergies   Atorvastatin    Review of Systems Review of Systems See HPI  Physical Exam Triage Vital Signs ED Triage Vitals [04/10/24 0824]  Encounter Vitals Group     BP (!) 151/79     Systolic BP Percentile      Diastolic BP Percentile      Pulse Rate (!) 59     Resp 20     Temp 98.7 F (37.1 C)     Temp Source Oral  SpO2 96 %     Weight      Height      Head Circumference      Peak Flow      Pain Score 8     Pain Loc      Pain Education      Exclude from Growth Chart    No data found.  Updated Vital Signs BP (!) 151/79 (BP Location: Right Arm)   Pulse (!) 59   Temp 98.7 F (37.1 C) (Oral)   Resp 20   SpO2 96%   Visual Acuity Right Eye Distance:   Left Eye Distance:   Bilateral Distance:    Right Eye Near:   Left Eye Near:    Bilateral Near:     Physical Exam Constitutional:      Appearance: Normal appearance.  HENT:     Head:     Jaw: Tenderness and pain on movement present. No swelling.      Left Ear: Hearing, tympanic membrane and ear canal normal.  Pulmonary:     Effort: Pulmonary effort is normal.  Musculoskeletal:        General: Normal range of motion.  Neurological:     Mental Status: He is alert.  Psychiatric:        Mood and Affect: Mood normal.      UC Treatments / Results  Labs (all labs ordered are listed, but only abnormal results are displayed) Labs Reviewed - No data to display  EKG   Radiology No results found.  Procedures Procedures (including critical care time)  Medications Ordered in UC Medications  triamcinolone acetonide (KENALOG-40) injection 40 mg (40 mg Intramuscular Given  04/10/24 0850)    Initial Impression / Assessment and Plan / UC Course  I have reviewed the triage vital signs and the nursing notes.  Pertinent labs & imaging results that were available during my care of the patient were reviewed by me and considered in my medical decision making (see chart for details).     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  Kenalog injection given here for pain and inflammation.  He can continue to take Tylenol as needed.  Recommend ice to the area.  Try to rest the jaw area is much as possible by eating soft foods.  Follow-up for any continued issues Final Clinical Impressions(s) / UC Diagnoses   Final diagnoses:  Arthralgia of left temporomandibular joint     Discharge Instructions      Steroid injection given here for pain and inflammation.  Recommend icing the area few times a day for about 20 minutes at a time. Try to rest the jaw area is much as possible eating soft foods. Follow-up for any continued issues  ED Prescriptions   None    PDMP not reviewed this encounter.   Landa Pine, FNP 04/10/24 1118

## 2024-04-10 NOTE — ED Triage Notes (Signed)
 Reporting left jaw pain, severe x 2 days. States can't hardly open mouth or chew food without significant pain. Reports acc. "Tickling" to left ear for months. Denies dental pain. States can't put any pressure on left side of face when laying in bed.

## 2024-04-10 NOTE — Discharge Instructions (Addendum)
 Steroid injection given here for pain and inflammation.  Recommend icing the area few times a day for about 20 minutes at a time. Try to rest the jaw area is much as possible eating soft foods. Follow-up for any continued issues

## 2024-05-02 ENCOUNTER — Other Ambulatory Visit: Payer: Self-pay | Admitting: Cardiology

## 2024-05-27 ENCOUNTER — Telehealth: Payer: Self-pay

## 2024-05-27 NOTE — Telephone Encounter (Signed)
 [facility] sent a letter requesting a refill on behalf of the patient. Our file shows 2 different strengths. I called and left a message requesting a call back to confirm which strength is he currently on.

## 2024-05-29 MED ORDER — HYDROCHLOROTHIAZIDE 12.5 MG PO CAPS: 25.0000 mg | ORAL_CAPSULE | Freq: Every day | ORAL | 4 refills | Status: AC

## 2024-05-29 NOTE — Telephone Encounter (Signed)
 Medication sent, patient notified.

## 2024-05-29 NOTE — Addendum Note (Signed)
 Addended by: Devinn Voshell M on: 05/29/2024 04:18 PM   Modules accepted: Orders

## 2024-07-15 ENCOUNTER — Ambulatory Visit (INDEPENDENT_AMBULATORY_CARE_PROVIDER_SITE_OTHER): Payer: Medicare Other

## 2024-07-15 VITALS — BP 150/73 | HR 51 | Temp 97.9°F | Resp 18 | Ht 72.0 in | Wt 208.4 lb

## 2024-07-15 DIAGNOSIS — E782 Mixed hyperlipidemia: Secondary | ICD-10-CM | POA: Diagnosis not present

## 2024-07-15 DIAGNOSIS — I70213 Atherosclerosis of native arteries of extremities with intermittent claudication, bilateral legs: Secondary | ICD-10-CM | POA: Diagnosis not present

## 2024-07-15 MED ORDER — INCLISIRAN SODIUM 284 MG/1.5ML ~~LOC~~ SOSY
284.0000 mg | PREFILLED_SYRINGE | Freq: Once | SUBCUTANEOUS | Status: AC
  Administered 2024-07-15: 284 mg via SUBCUTANEOUS
  Filled 2024-07-15: qty 1.5

## 2024-07-15 NOTE — Progress Notes (Signed)
 Diagnosis: Hyperlipidemia  Provider:  Mannam, Praveen MD  Procedure: Injection  Leqvio  (inclisiran), Dose: 284 mg, Site: subcutaneous, Number of injections: 1  Injection Site(s): Left lower quad. abdomen  Post Care: Patient declined observation  Discharge: Condition: Good, Destination: Home . AVS Provided  Performed by:  Kalani Sthilaire, RN

## 2024-07-24 ENCOUNTER — Ambulatory Visit

## 2024-07-24 ENCOUNTER — Encounter: Payer: Self-pay | Admitting: Cardiology

## 2024-07-24 ENCOUNTER — Ambulatory Visit: Attending: Cardiology | Admitting: Cardiology

## 2024-07-24 VITALS — BP 130/74 | HR 50 | Ht 72.0 in | Wt 207.0 lb

## 2024-07-24 DIAGNOSIS — I7121 Aneurysm of the ascending aorta, without rupture: Secondary | ICD-10-CM | POA: Insufficient documentation

## 2024-07-24 DIAGNOSIS — I480 Paroxysmal atrial fibrillation: Secondary | ICD-10-CM

## 2024-07-24 DIAGNOSIS — I10 Essential (primary) hypertension: Secondary | ICD-10-CM | POA: Diagnosis not present

## 2024-07-24 DIAGNOSIS — I639 Cerebral infarction, unspecified: Secondary | ICD-10-CM | POA: Insufficient documentation

## 2024-07-24 DIAGNOSIS — Z951 Presence of aortocoronary bypass graft: Secondary | ICD-10-CM | POA: Insufficient documentation

## 2024-07-24 NOTE — Patient Instructions (Signed)
 Medication Instructions:  Your physician recommends that you continue on your current medications as directed. Please refer to the Current Medication list given to you today.  *If you need a refill on your cardiac medications before your next appointment, please call your pharmacy*   Lab Work: None Ordered If you have labs (blood work) drawn today and your tests are completely normal, you will receive your results only by: MyChart Message (if you have MyChart) OR A paper copy in the mail If you have any lab test that is abnormal or we need to change your treatment, we will call you to review the results.   Testing/Procedures: Your physician has requested that you have a carotid duplex. This test is an ultrasound of the carotid arteries in your neck. It looks at blood flow through these arteries that supply the brain with blood. Allow one hour for this exam. There are no restrictions or special instructions.    WHY IS MY DOCTOR PRESCRIBING ZIO? The Zio system is proven and trusted by physicians to detect and diagnose irregular heart rhythms -- and has been prescribed to hundreds of thousands of patients.  The FDA has cleared the Zio system to monitor for many different kinds of irregular heart rhythms. In a study, physicians were able to reach a diagnosis 90% of the time with the Zio system1.  You can wear the Zio monitor -- a small, discreet, comfortable patch -- during your normal day-to-day activity, including while you sleep, shower, and exercise, while it records every single heartbeat for analysis.  1Barrett, P., et al. Comparison of 24 Hour Holter Monitoring Versus 14 Day Novel Adhesive Patch Electrocardiographic Monitoring. American Journal of Medicine, 2014.  ZIO VS. HOLTER MONITORING The Zio monitor can be comfortably worn for up to 14 days. Holter monitors can be worn for 24 to 48 hours, limiting the time to record any irregular heart rhythms you may have. Zio is able to capture  data for the 51% of patients who have their first symptom-triggered arrhythmia after 48 hours.1  LIVE WITHOUT RESTRICTIONS The Zio ambulatory cardiac monitor is a small, unobtrusive, and water-resistant patch--you might even forget you're wearing it. The Zio monitor records and stores every beat of your heart, whether you're sleeping, working out, or showering.     Follow-Up: At [facility], you and your health needs are our priority.  As part of our continuing mission to provide you with exceptional heart care, we have created designated Provider Care Teams.  These Care Teams include your primary Cardiologist (physician) and Advanced Practice Providers (APPs -  Physician Assistants and Nurse Practitioners) who all work together to provide you with the care you need, when you need it.  We recommend signing up for the patient portal called MyChart.  Sign up information is provided on this After Visit Summary.  MyChart is used to connect with patients for Virtual Visits (Telemedicine).  Patients are able to view lab/test results, encounter notes, upcoming appointments, etc.  Non-urgent messages can be sent to your provider as well.   To learn more about what you can do with MyChart, go to [email or web address].    Your next appointment:   3 month(s)  The format for your next appointment:   In Person  Provider:   Lamar Fitch, MD    Other Instructions Referral to Neurology- They will call for appt.

## 2024-07-24 NOTE — Progress Notes (Signed)
 Cardiology Office Note:    Date:  07/24/2024   ID:  Brady Singh, DOB 1950-12-12, MRN [id number]  PCP:  Pia Kerney SQUIBB, MD  Cardiologist:  Lamar Fitch, MD    Referring MD: Pia Kerney SQUIBB, MD   Chief Complaint  Patient presents with   Follow-up    History of Present Illness:    Brady Singh is a 73 y.o. male past medical history significant for coronary artery disease, in 2009 he did have PTCA and drug-eluting stent to the right coronary artery and circumflex artery in face of acute inferior posterior wall myocardial infarction.  In 2019 he ended up having another cardiac catheterization he was found to have significant three-vessel disease and coronary bypass graft was performed with LIMA to LAD SVG to diagonal 1 SVG to obtuse marginal branch and SVG to PDA ejection fraction was normal at that time after the surgery he had episode of atrial fibrillation with he converted spontaneously to sinus rhythm without evidence of recurrent arrhythmia.  He also got malignant right kidney cancer required surgical intervention, essential hypertension, dyslipidemia.  He comes today to my office after recent visit to the emergency room.  He still exercise aggressively goes to gym every single day at 5:00 in the morning and exercise the day of the visit in the emergency room he did see his usual routine then he sit in the chair start looking at the phone and then he became blurred vision still started having some balance issue incoherent eventually text to his wife she called back and he asked her to call 911 when he came to the emergency room his blood pressure was very high workup included CT of his head which showed some old stroke with possibility of new stroke.  He was offered to stay home for stress test however did not do it comes today to months for follow-up since that time which was 2 weeks ago he is doing great asymptomatic no chest pain tightness squeezing pressure burning chest still goes  to gym on the regular basis  Past Medical History:  Diagnosis Date   Abdominal aortic aneurysm without rupture (HCC) 07/26/2021   Acute blood loss anemia 06/02/2017   Allergic contact dermatitis due to plants, except food 07/26/2021   Arthritis 07/22/2021   Formatting of this note might be different from the original. Generalized, hands the worse   Ascending aortic aneurysm (HCC) 08/29/2022   Atherosclerosis of native arteries of extremities with intermittent claudication, bilateral legs (HCC) 07/26/2021   Atherosclerosis of native arteries of extremities with rest pain, unspecified extremity (HCC) 08/31/2015   Formatting of this note might be different from the original.  Overview:   Drug-eluting stent to RCA and circumflex in September 2009 in face of the inferior wall myocardial infarction  Formatting of this note might be different from the original.  Drug-eluting stent to RCA and circumflex in September 2009 in face of the inferior wall myocardial infarction     Atherosclerotic heart disease of native coronary artery without angina pectoris 08/31/2015   Formatting of this note might be different from the original. Overview:  Drug-eluting stent to RCA and circumflex in September 2009 in face of the inferior wall myocardial infarction Formatting of this note might be different from the original. Drug-eluting stent to RCA and circumflex in September 2009 in face of the inferior wall myocardial infarction   Atrial fibrillation (HCC) 06/07/2017   Benign prostatic hyperplasia with lower urinary tract symptoms 09/24/2014   Benign  prostatic hyperplasia with urinary obstruction 09/24/2014   Bilateral carotid artery stenosis 03/28/2017   Bladder cancer (HCC)    Blister of right foot 05/27/2020   Formatting of this note might be different from the original. Healing/resolving   BPH (benign prostatic hyperplasia)    Cancer (HCC) 11/28/2014   Formatting of this note might be different from the  original. Renal cell CA   Coronary artery stenosis 10/13/2014   Formatting of this note might be different from the original. 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 two stents   History of bladder cancer 07/22/2021   History of left-sided carotid endarterectomy 11/23/2017   Hyperglycemia due to type 2 diabetes mellitus (HCC) 07/26/2021   Hyperlipemia 10/13/2014   Hypothyroidism 07/26/2021   Insomnia 08/25/2022   Left inguinal hernia 10/13/2014   Long-term use of aspirin therapy 11/23/2017   Low back pain 10/25/2021   Malignant neoplasm of right kidney (HCC) 09/24/2014   Mixed hyperlipidemia 10/13/2014   Myalgia due to statin 09/06/2022   Myocardial infarction [facility]) 2009   Nocturia 07/22/2021   Formatting of this note might be different from the original. 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 11/23/2017   Peyronie's disease 10/13/2014   Plantar fasciitis of right foot 05/27/2020   Pleural effusion 02/05/2018   Polyneuropathy 07/26/2021   Postoperative atrial fibrillation (HCC) 06/07/2017   Postoperative voiding difficulty 07/22/2021   Pre-op evaluation 04/12/2017    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   Pure hypercholesterolemia 08/31/2015   Renal cell carcinoma, right (HCC) 12/29/2016   Renal mass, right 02/13/2015   Right renal mass    Spermatocele 10/13/2014   Status post coronary artery bypass graft LIMA to the LAD SVG to D1 SVG to OM1 SVG to PDA EF 70% 06/01/2017  06/02/2017   Umbilical hernia without obstruction or gangrene 03/28/2017   Unstable angina (HCC) 05/29/2017   Vitamin D deficiency 07/26/2021    Past Surgical History:  Procedure Laterality Date   CORONARY ANGIOPLASTY     Coronary angio w/ stent placement x 2    HERNIA REPAIR  2013   Left Inguinal Hernia repair   IR GENERIC HISTORICAL  12/09/2014   IR RADIOLOGIST EVAL & MGMT 12/09/2014 Ozell Specking, MD [facility] INTERV RAD   IR RADIOLOGIST EVAL & MGMT  01/25/2018   TONSILLECTOMY     TRANSURETHRAL RESECTION OF BLADDER TUMOR  2007; 2010     VASECTOMY      Current Medications: Current Meds  Medication Sig   amLODipine  (NORVASC ) 10 MG tablet TAKE 1 TABLET BY MOUTH EVERY DAY   aspirin 325 MG EC tablet Take 1 tablet by mouth daily.   carvedilol  (COREG ) 12.5 MG tablet TAKE 1 TABLET (12.5MG  TOTAL)  BY MOUTH TWICE A DAY WITH MEALS   gabapentin (NEURONTIN) 300 MG capsule Take 2 capsules by mouth 2 (two) times daily.   hydrochlorothiazide  (MICROZIDE ) 12.5 MG capsule Take 2 capsules (25 mg total) by mouth daily.   inclisiran (LEQVIO ) 284 MG/1.5ML SOSY injection Inject 284 mg into the skin every 6 (six) months.   lisinopril (PRINIVIL,ZESTRIL) 40 MG tablet Take 40 mg by mouth daily.   omeprazole (PRILOSEC) 40 MG capsule Take 40 mg by mouth daily.   tamsulosin (FLOMAX) 0.4 MG CAPS capsule Take 0.4 mg by mouth daily.     Allergies:   Atorvastatin    Social History   Socioeconomic History   Marital status: Married    Spouse name: Not on file   Number of children: Not on file   Years of education: Not on file   Highest education level: Not on file  Occupational History   Not  on file  Tobacco Use   Smoking status: Never   Smokeless tobacco: Never  Substance and Sexual Activity   Alcohol use: No    Alcohol/week: 0.0 standard drinks of alcohol   Drug use: No   Sexual activity: Yes  Other Topics Concern   Not on file  Social History Narrative   Not on file   Social Drivers of Health   Financial Resource Strain: Not on file  Food Insecurity: Not on file  Transportation Needs: Not on file  Physical Activity: Not on file  Stress: Not on file  Social Connections: Not on file     Family History: The patient's family history is not on file. ROS:   Please see the history of present illness.    All 14 point review of systems negative except as described per history of present illness  EKGs/Labs/Other Studies Reviewed:         Recent Labs: 08/18/2023: BUN 15; Creatinine, Ser 0.84; Potassium 3.5; Sodium 144 03/08/2024: ALT 28; Magnesium 2.4  Recent Lipid Panel    Component Value Date/Time   CHOL 115 03/08/2024 1010   TRIG 136 03/08/2024 1010   HDL 32 (L) 03/08/2024 1010   CHOLHDL 3.6 03/08/2024 1010   LDLCALC 59 03/08/2024 1010    Physical Exam:    VS:  BP 130/74   Pulse (!) 50   Ht 6' (1.829 m)   Wt 207 lb (93.9 kg)   SpO2 97%   BMI 28.07 kg/m     Wt Readings from Last 3 Encounters:  07/24/24 207 lb (93.9 kg)  07/15/24 208 lb 6.4 oz (94.5 kg)  03/08/24 207 lb 9.6 oz (94.2 kg)     GEN:  Well nourished, well developed in no acute distress HEENT: Normal NECK: No JVD; No carotid bruits LYMPHATICS: No lymphadenopathy CARDIAC: RRR, no murmurs, no rubs, no gallops RESPIRATORY:  Clear to auscultation without rales, wheezing or rhonchi  ABDOMEN: Soft, non-tender, non-distended MUSCULOSKELETAL:  No edema; No deformity  SKIN: Warm and dry LOWER EXTREMITIES: no swelling NEUROLOGIC:  Alert and oriented x 3 PSYCHIATRIC:  Normal affect   ASSESSMENT:    1. Status post coronary artery bypass graft LIMA to the LAD SVG to D1 SVG to OM1 SVG to  PDA EF 70% 06/01/2017    2. Paroxysmal atrial fibrillation (HCC)   3. Essential hypertension   4. Aneurysm of ascending aorta without rupture (HCC)    PLAN:    In order of problems listed above:  Some neurological event with evidence of CVA based on CT.  I will refer  him to neurologist, we will schedule him to have carotic ultrasounds, will do Zio patch to make sure he does not have any paroxysms of atrial fibrillation.  He did have episode of atrial fibrillation shortly after surgery but after that no recurrences.  He most likely will require dose of brain MRI. Paroxysmal atrial fibrillation monitor will be placed. Essential hypertension blood pressure well-controlled. Dyslipidemia he is on Leqvio  I did review K PN which show me his LDL 59 HDL 32 excellent control continue present management. Ascending aortic aneurysm.  Stable, last check in October of last year.  Will reschedule him to have CT of his chest to look at his thoracic aorta.   Medication Adjustments/Labs and Tests Ordered: Current medicines are reviewed at length with the patient today.  Concerns regarding medicines are outlined above.  No orders of the defined types were placed in this encounter.  Medication changes: No orders of the defined types were placed in this encounter.   Signed, Lamar DOROTHA Fitch, MD, [facility] 07/24/2024 10:40 AM    [facility]

## 2024-08-19 ENCOUNTER — Ambulatory Visit: Attending: Cardiology

## 2024-08-19 DIAGNOSIS — Z951 Presence of aortocoronary bypass graft: Secondary | ICD-10-CM | POA: Insufficient documentation

## 2024-08-19 DIAGNOSIS — I6523 Occlusion and stenosis of bilateral carotid arteries: Secondary | ICD-10-CM | POA: Insufficient documentation

## 2024-08-20 ENCOUNTER — Ambulatory Visit: Payer: Self-pay | Admitting: Cardiology

## 2024-08-26 DIAGNOSIS — I480 Paroxysmal atrial fibrillation: Secondary | ICD-10-CM

## 2024-08-29 ENCOUNTER — Other Ambulatory Visit: Payer: Self-pay | Admitting: Cardiology

## 2024-10-22 ENCOUNTER — Encounter: Payer: Self-pay | Admitting: Cardiology

## 2024-10-22 ENCOUNTER — Ambulatory Visit: Attending: Cardiology | Admitting: Cardiology

## 2024-10-22 ENCOUNTER — Other Ambulatory Visit (HOSPITAL_COMMUNITY): Payer: Self-pay

## 2024-10-22 ENCOUNTER — Telehealth: Payer: Self-pay | Admitting: Pharmacy Technician

## 2024-10-22 VITALS — BP 128/70 | HR 60 | Ht 72.0 in | Wt 204.0 lb

## 2024-10-22 DIAGNOSIS — I7121 Aneurysm of the ascending aorta, without rupture: Secondary | ICD-10-CM | POA: Diagnosis present

## 2024-10-22 DIAGNOSIS — I2510 Atherosclerotic heart disease of native coronary artery without angina pectoris: Secondary | ICD-10-CM | POA: Insufficient documentation

## 2024-10-22 DIAGNOSIS — I739 Peripheral vascular disease, unspecified: Secondary | ICD-10-CM | POA: Diagnosis present

## 2024-10-22 DIAGNOSIS — I480 Paroxysmal atrial fibrillation: Secondary | ICD-10-CM | POA: Diagnosis present

## 2024-10-22 DIAGNOSIS — R0609 Other forms of dyspnea: Secondary | ICD-10-CM | POA: Diagnosis present

## 2024-10-22 DIAGNOSIS — E1165 Type 2 diabetes mellitus with hyperglycemia: Secondary | ICD-10-CM | POA: Diagnosis present

## 2024-10-22 MED ORDER — APIXABAN 5 MG PO TABS: 5.0000 mg | ORAL_TABLET | Freq: Two times a day (BID) | ORAL | 6 refills | Status: AC

## 2024-10-22 NOTE — Patient Instructions (Addendum)
 Medication Instructions:   START: Eliquis  5mg  1 tablet twice daily  STOP: Aspirin   Lab Work: CBC,BMP, HgbA1C, Stool for blood- today   Your physician recommends that you return for lab work in: 2 weeks  You can come Monday through Friday 8:30 am to 12:00 pm and 1:15 to 4:30. You do not need to make an appointment as the order has already been placed.     Testing/Procedures: Your physician has requested that you have an echocardiogram. Echocardiography is a painless test that uses sound waves to create images of your heart. It provides your doctor with information about the size and shape of your heart and how well your heart's chambers and valves are working. This procedure takes approximately one hour. There are no restrictions for this procedure. Please do NOT wear cologne, perfume, aftershave, or lotions (deodorant is allowed). Please arrive 15 minutes prior to your appointment time.  Please note: We ask at that you not bring children with you during ultrasound (echo/ vascular) testing. Due to room size and safety concerns, children are not allowed in the ultrasound rooms during exams. Our front office staff cannot provide observation of children in our lobby area while testing is being conducted. An adult accompanying a patient to their appointment will only be allowed in the ultrasound room at the discretion of the ultrasound technician under special circumstances. We apologize for any inconvenience.    Follow-Up: At [facility], you and your health needs are our priority.  As part of our continuing mission to provide you with exceptional heart care, we have created designated Provider Care Teams.  These Care Teams include your primary Cardiologist (physician) and Advanced Practice Providers (APPs -  Physician Assistants and Nurse Practitioners) who all work together to provide you with the care you need, when you need it.  We recommend signing up for the patient portal called  MyChart.  Sign up information is provided on this After Visit Summary.  MyChart is used to connect with patients for Virtual Visits (Telemedicine).  Patients are able to view lab/test results, encounter notes, upcoming appointments, etc.  Non-urgent messages can be sent to your provider as well.   To learn more about what you can do with MyChart, go to [email or web address].    Your next appointment:   4 month(s)  The format for your next appointment:   In Person  Provider:   Lamar Fitch, MD    Other Instructions NA

## 2024-10-22 NOTE — Progress Notes (Signed)
 Cardiology Office Note:    Date:  10/22/2024   ID:  Brady Singh, DOB Dec 17, 1950, MRN [id number]  PCP:  Pia Kerney SQUIBB, MD  Cardiologist:  Lamar Fitch, MD    Referring MD: Pia Kerney SQUIBB, MD   Chief Complaint  Patient presents with   Follow-up    History of Present Illness:    Brady Singh is a 73 y.o. male  past medical history significant for coronary artery disease, in 2009 he did have PTCA and drug-eluting stent to the right coronary artery and circumflex artery in face of acute inferior posterior wall myocardial infarction. In 2019 he ended up having another cardiac catheterization he was found to have significant three-vessel disease and coronary bypass graft was performed with LIMA to LAD SVG to diagonal 1 SVG to obtuse marginal branch and SVG to PDA ejection fraction was normal at that time after the surgery he had episode of atrial fibrillation with he converted spontaneously to sinus rhythm without evidence of recurrent arrhythmia. He also got malignant right kidney cancer required surgical intervention, essential hypertension, dyslipidemia. He comes today to my office after recent visit to the emergency room. He still exercise aggressively goes to gym every single day at 5:00 in the morning and exercise the day of the visit in the emergency room he did see his usual routine then he sit in the chair start looking at the phone and then he became blurred vision still started having some balance issue incoherent eventually text to his wife she called back and he asked her to call 911 when he came to the emergency room his blood pressure was very high workup included CT of his head which showed some old stroke with possibility of new stroke.  Comes today to my office for follow-up.  He did wear a monitor monitor show episode of atrial fibrillation, likely low burden only 1%.  He does not feel any palpitations.  Overall doing well still goes to gym on a regular basis  Past  Medical History:  Diagnosis Date   Abdominal aortic aneurysm without rupture 07/26/2021   Acute blood loss anemia 06/02/2017   Allergic contact dermatitis due to plants, except food 07/26/2021   Arthritis 07/22/2021   Formatting of this note might be different from the original. Generalized, hands the worse   Ascending aortic aneurysm 08/29/2022   Atherosclerosis of native arteries of extremities with intermittent claudication, bilateral legs 07/26/2021   Atherosclerosis of native arteries of extremities with rest pain, unspecified extremity (HCC) 08/31/2015   Formatting of this note might be different from the original.  Overview:   Drug-eluting stent to RCA and circumflex in September 2009 in face of the inferior wall myocardial infarction  Formatting of this note might be different from the original.  Drug-eluting stent to RCA and circumflex in September 2009 in face of the inferior wall myocardial infarction     Atherosclerotic heart disease of native coronary artery without angina pectoris 08/31/2015   Formatting of this note might be different from the original. Overview:  Drug-eluting stent to RCA and circumflex in September 2009 in face of the inferior wall myocardial infarction Formatting of this note might be different from the original. Drug-eluting stent to RCA and circumflex in September 2009 in face of the inferior wall myocardial infarction   Atrial fibrillation (HCC) 06/07/2017   Benign prostatic hyperplasia with lower urinary tract symptoms 09/24/2014   Benign prostatic hyperplasia with urinary obstruction 09/24/2014   Bilateral carotid artery stenosis  03/28/2017   Bladder cancer (HCC)    Blister of right foot 05/27/2020   Formatting of this note might be different from the original. Healing/resolving   BPH (benign prostatic hyperplasia)    Cancer (HCC) 11/28/2014   Formatting of this note might be different from the original. Renal cell CA   Coronary artery stenosis  10/13/2014   Formatting of this note might be different from the original. 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 two stents   History of bladder cancer 07/22/2021   History of left-sided carotid endarterectomy 11/23/2017   Hyperglycemia due to type 2 diabetes mellitus (HCC) 07/26/2021   Hyperlipemia 10/13/2014   Hypothyroidism 07/26/2021   Insomnia 08/25/2022   Left inguinal hernia 10/13/2014   Long-term use of aspirin therapy 11/23/2017   Low back pain 10/25/2021   Malignant neoplasm of right kidney (HCC) 09/24/2014   Mixed hyperlipidemia 10/13/2014   Myalgia due to statin 09/06/2022   Myocardial infarction [facility]) 2009   Nocturia 07/22/2021   Formatting of this note might be different from the original. 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 11/23/2017   Peyronie's disease 10/13/2014   Plantar fasciitis of right foot 05/27/2020   Pleural effusion 02/05/2018   Polyneuropathy 07/26/2021   Postoperative atrial fibrillation (HCC) 06/07/2017   Postoperative voiding difficulty 07/22/2021   Pre-op evaluation 04/12/2017   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   Pure  hypercholesterolemia 08/31/2015   Renal cell carcinoma, right (HCC) 12/29/2016   Renal mass, right 02/13/2015   Right renal mass    Spermatocele 10/13/2014   Status post coronary artery bypass graft LIMA to the LAD SVG to D1 SVG to OM1 SVG to PDA EF 70% 06/01/2017  06/02/2017   Umbilical hernia without obstruction or gangrene 03/28/2017   Unstable angina (HCC) 05/29/2017   Vitamin D deficiency 07/26/2021    Past Surgical History:  Procedure Laterality Date   CORONARY ANGIOPLASTY     Coronary angio w/ stent placement x 2    HERNIA REPAIR  2013   Left Inguinal Hernia repair   IR GENERIC HISTORICAL  12/09/2014   IR RADIOLOGIST EVAL & MGMT 12/09/2014 Ozell Specking, MD [facility] INTERV RAD   IR RADIOLOGIST EVAL & MGMT  01/25/2018   TONSILLECTOMY     TRANSURETHRAL RESECTION OF BLADDER TUMOR  2007; 2010     VASECTOMY      Current Medications: Current Meds  Medication Sig   amLODipine  (NORVASC ) 10 MG tablet TAKE 1 TABLET BY MOUTH EVERY DAY   aspirin 325 MG EC tablet Take 1 tablet by mouth daily.   carvedilol  (COREG ) 12.5 MG tablet TAKE 1 TABLET (12.5MG  TOTAL) BY MOUTH TWICE A DAY WITH MEALS   gabapentin (NEURONTIN) 300 MG  capsule Take 2 capsules by mouth 2 (two) times daily.   hydrochlorothiazide  (MICROZIDE ) 12.5 MG capsule Take 2 capsules (25 mg total) by mouth daily.   inclisiran (LEQVIO ) 284 MG/1.5ML SOSY injection Inject 284 mg into the skin every 6 (six) months.   lisinopril (PRINIVIL,ZESTRIL) 40 MG tablet Take 40 mg by mouth daily.   omeprazole (PRILOSEC) 40 MG capsule Take 40 mg by mouth daily.   potassium chloride (KLOR-CON) 10 MEQ tablet Take 10 mEq by mouth daily.   tamsulosin (FLOMAX) 0.4 MG CAPS capsule Take 0.4 mg by mouth daily.     Allergies:   Atorvastatin    Social History   Socioeconomic History   Marital status: Married    Spouse name: Not on file   Number of children: Not on file   Years of education: Not on file   Highest education level: Not on file   Occupational History   Not on file  Tobacco Use   Smoking status: Never   Smokeless tobacco: Never  Substance and Sexual Activity   Alcohol use: No    Alcohol/week: 0.0 standard drinks of alcohol   Drug use: No   Sexual activity: Yes  Other Topics Concern   Not on file  Social History Narrative   Not on file   Social Drivers of Health   Financial Resource Strain: Not on file  Food Insecurity: Not on file  Transportation Needs: Not on file  Physical Activity: Not on file  Stress: Not on file  Social Connections: Not on file     Family History: The patient's family history is not on file. ROS:   Please see the history of present illness.    All 14 point review of systems negative except as described per history of present illness  EKGs/Labs/Other Studies Reviewed:         Recent Labs: 03/08/2024: ALT 28; Magnesium 2.4  Recent Lipid Panel    Component Value Date/Time   CHOL 115 03/08/2024 1010   TRIG 136 03/08/2024 1010   HDL 32 (L) 03/08/2024 1010   CHOLHDL 3.6 03/08/2024 1010   LDLCALC 59 03/08/2024 1010    Physical Exam:    VS:  BP 128/70   Pulse 60   Ht 6' (1.829 m)   Wt 204 lb (92.5 kg)   SpO2 97%   BMI 27.67 kg/m     Wt Readings from Last 3 Encounters:  10/22/24 204 lb (92.5 kg)  07/24/24 207 lb (93.9 kg)  07/15/24 208 lb 6.4 oz (94.5 kg)     GEN:  Well nourished, well developed in no acute distress HEENT: Normal NECK: No JVD; No carotid bruits LYMPHATICS: No lymphadenopathy CARDIAC: RRR, no murmurs, no rubs, no gallops RESPIRATORY:  Clear to auscultation without rales, wheezing or rhonchi  ABDOMEN: Soft, non-tender, non-distended MUSCULOSKELETAL:  No edema; No deformity  SKIN: Warm and dry LOWER EXTREMITIES: no swelling NEUROLOGIC:  Alert and oriented x 3 PSYCHIATRIC:  Normal affect   ASSESSMENT:    1. Paroxysmal atrial fibrillation (HCC)   2. PAD (peripheral artery disease)   3. Coronary artery stenosis   4. Aneurysm of ascending  aorta without rupture    PLAN:    In order of problems listed above:  Paroxysmal atrial fibrillation initially only 1 episode after about postsurgery but now monitor showed 1% burden with recent CVA will initiate anticoagulation, I will put him on Eliquis  5 mg twice daily, stop his aspirin.  Recheck CBC Chem-7 as well as stool for guaiac  today and then will check CBC in about 2 weeks later.  I ask him to let me know if he develops some symptoms of it. Peripheral vascular disease bilateral up to 39% carotid artery stenosis not critical will continue monitoring. Coronary disease stable exercise aggressively every single day goes to gym 5 times a week encouraged him to continue. Ascending aortic aneurysm measuring 45 mm, will repeat echocardiogram to check on the   Medication Adjustments/Labs and Tests Ordered: Current medicines are reviewed at length with the patient today.  Concerns regarding medicines are outlined above.  No orders of the defined types were placed in this encounter.  Medication changes: No orders of the defined types were placed in this encounter.   Signed, Lamar DOROTHA Fitch, MD, [facility] 10/22/2024 8:36 AM    [facility]

## 2024-10-22 NOTE — Telephone Encounter (Signed)
     477.68 for 30 days due to deductible 475.75 for xarelto for 30 days  96.52 for Dabigatran for 30 days or 56.66 on goodrx for 30 days at [facility]  I tried to get him a grant but it is showing he makes too much for approval of the [employer] grant. [employer] ID [id number].

## 2024-10-23 LAB — FECAL OCCULT BLOOD, IMMUNOCHEMICAL: Fecal Occult Bld: NEGATIVE

## 2024-10-23 LAB — CBC
Hematocrit: 48.5 % (ref 37.5–51.0)
Hemoglobin: 15.6 g/dL (ref 13.0–17.7)
MCH: 28.4 pg (ref 26.6–33.0)
MCHC: 32.2 g/dL (ref 31.5–35.7)
MCV: 88 fL (ref 79–97)
Platelets: 222 x10E3/uL (ref 150–450)
RBC: 5.5 x10E6/uL (ref 4.14–5.80)
RDW: 13.4 % (ref 11.6–15.4)
WBC: 6.3 x10E3/uL (ref 3.4–10.8)

## 2024-10-23 LAB — BASIC METABOLIC PANEL WITH GFR
BUN/Creatinine Ratio: 17 (ref 10–24)
BUN: 16 mg/dL (ref 8–27)
CO2: 24 mmol/L (ref 20–29)
Calcium: 9 mg/dL (ref 8.6–10.2)
Chloride: 97 mmol/L (ref 96–106)
Creatinine, Ser: 0.96 mg/dL (ref 0.76–1.27)
Glucose: 135 mg/dL — ABNORMAL HIGH (ref 70–99)
Potassium: 3.7 mmol/L (ref 3.5–5.2)
Sodium: 138 mmol/L (ref 134–144)
eGFR: 83 mL/min/1.73 (ref >59)

## 2024-10-23 LAB — HEMOGLOBIN A1C
Est. average glucose Bld gHb Est-mCnc: 117 mg/dL
Hgb A1c MFr Bld: 5.7 % — ABNORMAL HIGH (ref 4.8–5.6)

## 2024-11-01 ENCOUNTER — Ambulatory Visit: Payer: Self-pay | Admitting: Cardiology

## 2024-11-19 ENCOUNTER — Telehealth: Payer: Self-pay | Admitting: Cardiology

## 2024-11-19 ENCOUNTER — Ambulatory Visit: Attending: Cardiology

## 2024-11-19 DIAGNOSIS — R0609 Other forms of dyspnea: Secondary | ICD-10-CM | POA: Insufficient documentation

## 2024-11-19 LAB — ECHOCARDIOGRAM COMPLETE
Area-P 1/2: 4.06 cm2
S' Lateral: 3.6 cm

## 2024-11-19 MED ORDER — APIXABAN 5 MG PO TABS: 5.0000 mg | ORAL_TABLET | Freq: Two times a day (BID) | ORAL | Status: AC

## 2024-11-19 NOTE — Telephone Encounter (Signed)
 Patient had an echo done this morning and while checking out asked for Eliquis  5mg  samples and patient assistance forms for Eliquis  as well. Patient left for another appointment but would like to come back before going out of town for Christmas later on this morning. If someone could give him a call if we have any samples. Thank you CB # [phone]

## 2024-11-19 NOTE — Telephone Encounter (Signed)
 Eliquis  dose Age 73 Weight 92.5kg  5 mg BID   Pt assistance forms printed.

## 2024-11-25 ENCOUNTER — Telehealth: Payer: Self-pay

## 2024-11-25 NOTE — Telephone Encounter (Signed)
LVM per DPR- per Dr. Vanetta Shawl note regarding Echo results. Encouraged to call with any questions. Routed to PCP.

## 2024-12-12 ENCOUNTER — Telehealth (HOSPITAL_COMMUNITY): Payer: Self-pay | Admitting: Pharmacist

## 2024-12-12 ENCOUNTER — Telehealth: Payer: Self-pay

## 2024-12-12 ENCOUNTER — Other Ambulatory Visit: Payer: Self-pay | Admitting: Pharmacist

## 2024-12-12 NOTE — Telephone Encounter (Signed)
 Auth Submission: NO AUTH NEEDED Site of care: Site of care: [facility] [place] Payer: Medicare A/B with BCBS supplement Medication & CPT/J Code(s) submitted: Leqvio  (Inclisiran) J1306 Diagnosis Code:  Route of submission (phone, fax, portal):  Phone # Fax # Auth type: Buy/Bill PB Units/visits requested: 284mg  x 2 doses Reference number:  Approval from: 12/12/24 to 12/28/24

## 2024-12-12 NOTE — Telephone Encounter (Signed)
 Therapy plan site of care changed to [place]  Leqvio  treatment plan re-ordered. Last entered in 2023  Sherry Pennant, PharmD, MPH, BCPS, CPP Clinical Pharmacist

## 2025-01-16 ENCOUNTER — Ambulatory Visit

## 2025-02-19 ENCOUNTER — Ambulatory Visit: Admitting: Cardiology
